# Patient Record
Sex: Male | Born: 1960 | Race: White | Hispanic: No | Marital: Married | Smoking: Former smoker
Health system: Southern US, Community
[De-identification: ages and names within clinical notes are randomized; demographics above are authoritative.]

## PROBLEM LIST (undated history)

## (undated) DIAGNOSIS — N189 Chronic kidney disease, unspecified: Secondary | ICD-10-CM

## (undated) DIAGNOSIS — I2510 Atherosclerotic heart disease of native coronary artery without angina pectoris: Secondary | ICD-10-CM

## (undated) DIAGNOSIS — Z8551 Personal history of malignant neoplasm of bladder: Secondary | ICD-10-CM

## (undated) DIAGNOSIS — M1990 Unspecified osteoarthritis, unspecified site: Secondary | ICD-10-CM

## (undated) DIAGNOSIS — E039 Hypothyroidism, unspecified: Secondary | ICD-10-CM

## (undated) DIAGNOSIS — R011 Cardiac murmur, unspecified: Secondary | ICD-10-CM

## (undated) DIAGNOSIS — C801 Malignant (primary) neoplasm, unspecified: Secondary | ICD-10-CM

## (undated) HISTORY — PX: OTHER SURGICAL HISTORY: SHX169

## (undated) HISTORY — PX: HERNIA REPAIR: SHX51

## (undated) HISTORY — PX: APPENDECTOMY: SHX54

## (undated) HISTORY — PX: BLADDER SURGERY: SHX569

## (undated) HISTORY — PX: TONSILLECTOMY: SUR1361

## (undated) HISTORY — DX: Personal history of malignant neoplasm of bladder: Z85.51

---

## 2004-10-31 ENCOUNTER — Ambulatory Visit: Payer: Self-pay | Admitting: Urology

## 2005-02-14 ENCOUNTER — Ambulatory Visit: Payer: Self-pay | Admitting: Ophthalmology

## 2006-09-18 ENCOUNTER — Ambulatory Visit: Payer: Self-pay | Admitting: Urology

## 2007-01-30 ENCOUNTER — Encounter: Payer: Self-pay | Admitting: Family Medicine

## 2007-02-08 ENCOUNTER — Encounter: Payer: Self-pay | Admitting: Family Medicine

## 2009-10-12 ENCOUNTER — Ambulatory Visit: Payer: Self-pay | Admitting: Ophthalmology

## 2012-08-15 ENCOUNTER — Ambulatory Visit: Payer: Self-pay | Admitting: General Surgery

## 2012-12-08 DIAGNOSIS — Z8551 Personal history of malignant neoplasm of bladder: Secondary | ICD-10-CM | POA: Insufficient documentation

## 2014-01-29 DIAGNOSIS — C649 Malignant neoplasm of unspecified kidney, except renal pelvis: Secondary | ICD-10-CM | POA: Insufficient documentation

## 2014-02-08 DIAGNOSIS — N433 Hydrocele, unspecified: Secondary | ICD-10-CM | POA: Insufficient documentation

## 2014-11-01 ENCOUNTER — Ambulatory Visit: Payer: Self-pay | Admitting: Family Medicine

## 2015-03-29 NOTE — Op Note (Signed)
PATIENT NAME:  Marc [name], Marc [name] MR#:  [id number] DATE OF BIRTH:  14-Aug-1961  DATE OF PROCEDURE:  08/15/2012  PREOPERATIVE DIAGNOSIS: Umbilical hernia.   POSTOPERATIVE DIAGNOSIS: Umbilical hernia.   OPERATIVE PROCEDURE: Primary repair of umbilical hernia.   OPERATING SURGEON: Robert Bellow, MD   ANESTHESIA: General by LMA, Marcaine 0.5% plain, 30 mL local infiltration, Toradol 30 mg.   ESTIMATED BLOOD LOSS: Minimal.   CLINICAL NOTE: This 54 year old male has developed a symptomatic hernia. He is admitted for elective repair. He received Kefzol prior to the procedure in the event mesh was required for repair.   OPERATIVE NOTE: Hair was removed with clippers prior to the procedure. The abdomen was prepped with ChloraPrep and draped. An infraumbilical incision was made and carried down through the skin and subcutaneous tissue with hemostasis achieved by electrocautery. He was found to have a small 8 mm fascial defect of the umbilicus. The undersurface was cleared and the defect was closed with interrupted 0 Surgilon sutures. The umbilical skin was tacked to the fascia with 2-0 Vicryl and the adipose layer approximated with a running 2-0 Vicryl suture. Skin was closed with a running 4-0 Vicryl subcuticular suture. Benzoin, Steri-Strips, Telfa, and Tegaderm dressing was then applied.   The patient tolerated the procedure well and was taken to the recovery room in stable condition.   ____________________________ Robert Bellow, MD jwb:drc D: 08/15/2012 14:08:55 ET T: 08/16/2012 09:17:13 ET JOB#: [id number]  cc: Robert Bellow, MD, <Dictator> Jerrell Belfast, MD Zuly Belkin Amedeo Kinsman MD ELECTRONICALLY SIGNED 08/21/2012 15:00

## 2015-05-30 ENCOUNTER — Other Ambulatory Visit: Payer: Self-pay | Admitting: Family Medicine

## 2015-05-30 DIAGNOSIS — E039 Hypothyroidism, unspecified: Secondary | ICD-10-CM

## 2015-05-31 DIAGNOSIS — E039 Hypothyroidism, unspecified: Secondary | ICD-10-CM | POA: Insufficient documentation

## 2015-05-31 NOTE — Telephone Encounter (Signed)
Last refill 05/25/2014. Last FU OV 01/20/2014. Last TSH 01/22/2014; result 8.340. FYI pt is taking 250 mcg of Levothyroxine. Pt was supposed to have TSH and T4 rechecked 4 weeks after last TSH, and never had the labs repeated. Renaldo Fiddler, CMA

## 2015-08-06 ENCOUNTER — Other Ambulatory Visit: Payer: Self-pay | Admitting: Family Medicine

## 2015-08-09 ENCOUNTER — Other Ambulatory Visit: Payer: Self-pay | Admitting: Family Medicine

## 2015-08-10 ENCOUNTER — Other Ambulatory Visit: Payer: Self-pay | Admitting: Family Medicine

## 2015-08-10 DIAGNOSIS — E039 Hypothyroidism, unspecified: Secondary | ICD-10-CM

## 2015-08-10 NOTE — Telephone Encounter (Signed)
Last apt 10/2014  Thanks,   -Mickel Baas

## 2016-05-16 ENCOUNTER — Ambulatory Visit: Payer: Self-pay | Admitting: Physician Assistant

## 2016-05-22 DIAGNOSIS — R9431 Abnormal electrocardiogram [ECG] [EKG]: Secondary | ICD-10-CM | POA: Insufficient documentation

## 2016-05-24 NOTE — Pre-Procedure Instructions (Signed)
Marc [name]  05/24/2016      [id number] [address]on, [place] - [place] [address] Phone: [phone] Fax: [facility] - [place], [place] - [address] [place] [address] Phone: [phone] Fax: [phone]    Your procedure is scheduled on June 21.  Report to [facility] Admitting at 630 A.M.  Call this number if you have problems the morning of surgery:  [phone]   Remember:  Do not eat food or drink liquids after midnight.  Take these medicines the morning of surgery with A SIP OF WATER tylenol if needed, levothyroxine (Synthroid), timolol (Betmol) eye drops  Stop taking aspirin, Ibuprofen, Advil, motrin, BC's, Goody's, Herbal medications, Fish oil, Aleve   Do not wear jewelry, make-up or nail polish.  Do not wear lotions, powders, or perfumes.  You may wear deoderant.  Do not shave 48 hours prior to surgery.  Men may shave face and neck.  Do not bring valuables to the hospital.  [facility] is not responsible for any belongings or valuables.  Contacts, dentures or bridgework may not be worn into surgery.  Leave your suitcase in the car.  After surgery it may be brought to your room.  For patients admitted to the hospital, discharge time will be determined by your treatment team.  Patients discharged the day of surgery will not be allowed to drive home.    Special instructions:  [facility] - Preparing for Surgery  Before surgery, you can play an important role.  Because skin is not sterile, your skin needs to be as free of germs as possible.  You can reduce the number of germs on you skin by washing with CHG (chlorahexidine gluconate) soap before surgery.  CHG is an antiseptic cleaner which kills germs and bonds with the skin to continue killing germs even after washing.  Please DO NOT use if you have an allergy to  CHG or antibacterial soaps.  If your skin becomes reddened/irritated stop using the CHG and inform your nurse when you arrive at Short Stay.  Do not shave (including legs and underarms) for at least 48 hours prior to the first CHG shower.  You may shave your face.  Please follow these instructions carefully:   1.  Shower with CHG Soap the night before surgery and the   morning of Surgery.  2.  If you choose to wash your hair, wash your hair first as usual with your  normal shampoo.  3.  After you shampoo, rinse your hair and body thoroughly to remove the Shampoo.  4.  Use CHG as you would any other liquid soap.  You can apply chg directly  to the skin and wash gently with scrungie or a clean washcloth.  5.  Apply the CHG Soap to your body ONLY FROM THE NECK DOWN.   Do not use on open wounds or open sores.  Avoid contact with your eyes,   ears, mouth and genitals (private parts).  Wash genitals (private parts)  with your normal soap.  6.  Wash thoroughly, paying special attention to the area where your surgery will be performed.  7.  Thoroughly rinse your body with warm water from the neck down.  8.  DO NOT shower/wash with your normal soap after using and rinsing off   the CHG Soap.  9.  Pat yourself dry with a clean towel.            10.  Wear clean pajamas.            11.  Place clean sheets on your bed the night of your first shower and do not sleep with pets.  Day of Surgery  Do not apply any lotions/deoderants the morning of surgery.  Please wear clean clothes to the hospital/surgery center.     Please read over the following fact sheets that you were given. Pain Booklet, MRSA Information and Surgical Site Infection Prevention

## 2016-05-25 ENCOUNTER — Encounter (HOSPITAL_COMMUNITY)
Admission: RE | Admit: 2016-05-25 | Discharge: 2016-05-25 | Disposition: A | Discharge status: Home / Self Care | Payer: 59 | Source: Ambulatory Visit | Attending: Orthopedic Surgery | Admitting: Orthopedic Surgery

## 2016-05-25 ENCOUNTER — Encounter (HOSPITAL_COMMUNITY): Payer: Self-pay

## 2016-05-25 DIAGNOSIS — Z905 Acquired absence of kidney: Secondary | ICD-10-CM | POA: Diagnosis not present

## 2016-05-25 DIAGNOSIS — M4806 Spinal stenosis, lumbar region: Secondary | ICD-10-CM | POA: Diagnosis not present

## 2016-05-25 DIAGNOSIS — M5126 Other intervertebral disc displacement, lumbar region: Secondary | ICD-10-CM | POA: Insufficient documentation

## 2016-05-25 DIAGNOSIS — Z01818 Encounter for other preprocedural examination: Secondary | ICD-10-CM | POA: Diagnosis not present

## 2016-05-25 DIAGNOSIS — Z85528 Personal history of other malignant neoplasm of kidney: Secondary | ICD-10-CM | POA: Insufficient documentation

## 2016-05-25 DIAGNOSIS — Z01812 Encounter for preprocedural laboratory examination: Secondary | ICD-10-CM | POA: Diagnosis not present

## 2016-05-25 DIAGNOSIS — E039 Hypothyroidism, unspecified: Secondary | ICD-10-CM | POA: Diagnosis not present

## 2016-05-25 DIAGNOSIS — Z79899 Other long term (current) drug therapy: Secondary | ICD-10-CM | POA: Diagnosis not present

## 2016-05-25 DIAGNOSIS — H409 Unspecified glaucoma: Secondary | ICD-10-CM | POA: Diagnosis not present

## 2016-05-25 DIAGNOSIS — Z87891 Personal history of nicotine dependence: Secondary | ICD-10-CM | POA: Insufficient documentation

## 2016-05-25 HISTORY — DX: Chronic kidney disease, unspecified: N18.9

## 2016-05-25 HISTORY — DX: Cardiac murmur, unspecified: R01.1

## 2016-05-25 HISTORY — DX: Hypothyroidism, unspecified: E03.9

## 2016-05-25 HISTORY — DX: Malignant (primary) neoplasm, unspecified: C80.1

## 2016-05-25 LAB — COMPREHENSIVE METABOLIC PANEL
ALT: 19 U/L (ref 17–63)
AST: 19 U/L (ref 15–41)
Albumin: 4.2 g/dL (ref 3.5–5.0)
Alkaline Phosphatase: 63 U/L (ref 38–126)
Anion gap: 10 (ref 5–15)
BUN: 26 mg/dL — AB (ref 6–20)
CHLORIDE: 107 mmol/L (ref 101–111)
CO2: 19 mmol/L — AB (ref 22–32)
CREATININE: 1.24 mg/dL (ref 0.61–1.24)
Calcium: 9.6 mg/dL (ref 8.9–10.3)
GFR calc Af Amer: >60 mL/min (ref >60)
Glucose, Bld: 85 mg/dL (ref 65–99)
Potassium: 4.5 mmol/L (ref 3.5–5.1)
Sodium: 136 mmol/L (ref 135–145)
Total Bilirubin: 0.5 mg/dL (ref 0.3–1.2)
Total Protein: 6.5 g/dL (ref 6.5–8.1)

## 2016-05-25 LAB — CBC
HCT: 45.6 % (ref 39.0–52.0)
Hemoglobin: 16.4 g/dL (ref 13.0–17.0)
MCH: 31.7 pg (ref 26.0–34.0)
MCHC: 36 g/dL (ref 30.0–36.0)
MCV: 88 fL (ref 78.0–100.0)
PLATELETS: 181 10*3/uL (ref 150–400)
RBC: 5.18 MIL/uL (ref 4.22–5.81)
RDW: 13.1 % (ref 11.5–15.5)
WBC: 6.2 10*3/uL (ref 4.0–10.5)

## 2016-05-25 LAB — SURGICAL PCR SCREEN
MRSA, PCR: NEGATIVE
STAPHYLOCOCCUS AUREUS: NEGATIVE

## 2016-05-25 NOTE — Progress Notes (Signed)
PCP is Dr. Chrystine Oiler Cardiologist is Dr. Josefa Half Pt states he had a stress test yesterday at [facility] sent for results, last office visit, and EKG requested Denies ever having a card cath or echo.

## 2016-05-28 NOTE — Progress Notes (Signed)
Anesthesia Chart Review:  Pt is a 55 year old male scheduled for L2-3 lumbar decompression discectomy on 05/30/2016 with Dr. Rolena Infante.   PCP is Dr. Maryland Pink (care everywhere)  PMH includes:  Heart murmur, hypothyroidism, renal cell carcinoma (s/p R nephrectomy 2015), glaucoma. Former smoker.   Medications include: levothyroxine, timolol ophthalmic.   Preoperative labs reviewed.    EKG 05/17/16: sinus rhythm. Q waves in aVF.   Nuclear stress test 05/24/16 (care everywhere):  1.Mild reduced left ventricular function  2.Apical wall hypokinesis  3.No evidence for scar or ischemia  Pt saw Dr. Saralyn Pilar with cardiology (care everywhere) for pre-op eval; stress test ordered as above.   If no changes, I anticipate pt can proceed with surgery as scheduled.   Willeen Cass, FNP-BC [facility] Short Stay Surgical Center/Anesthesiology Phone: [phone] 05/28/2016 1:46 PM

## 2016-05-29 MED ORDER — VANCOMYCIN HCL 10 G IV SOLR
1500.0000 mg | INTRAVENOUS | Status: AC
  Administered 2016-05-30: 1500 mg via INTRAVENOUS
  Filled 2016-05-29: qty 1500

## 2016-05-29 NOTE — Anesthesia Preprocedure Evaluation (Addendum)
Anesthesia Evaluation  Patient identified by MRN, date of birth, ID band Patient awake    Reviewed: Allergy & Precautions, NPO status , Patient's Chart, lab work & pertinent test results  Airway Mallampati: II   Neck ROM: Full    Dental  (+) Dental Advisory Given   Pulmonary neg pulmonary ROS, former smoker (quit 2005),    breath sounds clear to auscultation       Cardiovascular negative cardio ROS   Rhythm:Regular  STRESS 05/2016  LVF 43%, no scar or ischemia noted, cleared by a cardiologist   Neuro/Psych negative neurological ROS  negative psych ROS   GI/Hepatic negative GI ROS, Neg liver ROS,   Endo/Other  Hypothyroidism Obesity BMI 35.5  Renal/GU negative Renal ROSSP Right nephrectomy for renal cell CA, Creat 1.24  negative genitourinary   Musculoskeletal negative musculoskeletal ROS (+)   Abdominal   Peds negative pediatric ROS (+)  Hematology negative hematology ROS (+) 16/45   Anesthesia Other Findings   Reproductive/Obstetrics negative OB ROS                         Anesthesia Physical Anesthesia Plan  ASA: III  Anesthesia Plan: General   Post-op Pain Management:    Induction: Intravenous  Airway Management Planned: Oral ETT and Video Laryngoscope Planned  Additional Equipment:   Intra-op Plan:   Post-operative Plan: Extubation in OR  Informed Consent: I have reviewed the patients History and Physical, chart, labs and discussed the procedure including the risks, benefits and alternatives for the proposed anesthesia with the patient or authorized representative who has indicated his/her understanding and acceptance.     Plan Discussed with:   Anesthesia Plan Comments: (No Toradol SP right nephrectomy)        Anesthesia Quick Evaluation

## 2016-05-30 ENCOUNTER — Ambulatory Visit (HOSPITAL_COMMUNITY): Payer: 59

## 2016-05-30 ENCOUNTER — Ambulatory Visit (HOSPITAL_COMMUNITY): Payer: 59 | Admitting: Emergency Medicine

## 2016-05-30 ENCOUNTER — Observation Stay (HOSPITAL_COMMUNITY)
Admission: RE | Admit: 2016-05-30 | Discharge: 2016-05-30 | Disposition: A | Discharge status: Home / Self Care | Payer: 59 | Source: Ambulatory Visit | Attending: Orthopedic Surgery | Admitting: Orthopedic Surgery

## 2016-05-30 ENCOUNTER — Encounter (HOSPITAL_COMMUNITY): Payer: Self-pay | Admitting: Surgery

## 2016-05-30 ENCOUNTER — Encounter (HOSPITAL_COMMUNITY)
Admission: RE | Disposition: A | Discharge status: Home / Self Care | Payer: Self-pay | Source: Ambulatory Visit | Attending: Orthopedic Surgery

## 2016-05-30 ENCOUNTER — Ambulatory Visit (HOSPITAL_COMMUNITY): Payer: 59 | Admitting: Anesthesiology

## 2016-05-30 DIAGNOSIS — E039 Hypothyroidism, unspecified: Secondary | ICD-10-CM | POA: Diagnosis not present

## 2016-05-30 DIAGNOSIS — Z87891 Personal history of nicotine dependence: Secondary | ICD-10-CM | POA: Diagnosis not present

## 2016-05-30 DIAGNOSIS — M545 Low back pain, unspecified: Secondary | ICD-10-CM | POA: Insufficient documentation

## 2016-05-30 DIAGNOSIS — Z419 Encounter for procedure for purposes other than remedying health state, unspecified: Secondary | ICD-10-CM

## 2016-05-30 DIAGNOSIS — M5410 Radiculopathy, site unspecified: Secondary | ICD-10-CM | POA: Insufficient documentation

## 2016-05-30 DIAGNOSIS — Z905 Acquired absence of kidney: Secondary | ICD-10-CM | POA: Insufficient documentation

## 2016-05-30 DIAGNOSIS — Z6835 Body mass index (BMI) 35.0-35.9, adult: Secondary | ICD-10-CM | POA: Insufficient documentation

## 2016-05-30 DIAGNOSIS — M5126 Other intervertebral disc displacement, lumbar region: Principal | ICD-10-CM | POA: Insufficient documentation

## 2016-05-30 DIAGNOSIS — Z85528 Personal history of other malignant neoplasm of kidney: Secondary | ICD-10-CM | POA: Insufficient documentation

## 2016-05-30 DIAGNOSIS — E669 Obesity, unspecified: Secondary | ICD-10-CM | POA: Diagnosis not present

## 2016-05-30 DIAGNOSIS — Z880 Allergy status to penicillin: Secondary | ICD-10-CM | POA: Insufficient documentation

## 2016-05-30 DIAGNOSIS — M549 Dorsalgia, unspecified: Secondary | ICD-10-CM | POA: Diagnosis present

## 2016-05-30 HISTORY — PX: LUMBAR LAMINECTOMY/DECOMPRESSION MICRODISCECTOMY: SHX5026

## 2016-05-30 SURGERY — LUMBAR LAMINECTOMY/DECOMPRESSION MICRODISCECTOMY 1 LEVEL
Anesthesia: General | Site: Spine Lumbar

## 2016-05-30 MED ORDER — METHOCARBAMOL 500 MG PO TABS: 500.0000 mg | ORAL_TABLET | Freq: Three times a day (TID) | ORAL | Status: DC | PRN

## 2016-05-30 MED ORDER — OXYCODONE HCL 5 MG PO TABS
10.0000 mg | ORAL_TABLET | ORAL | Status: DC | PRN
  Administered 2016-05-30 (×2): 10 mg via ORAL
  Filled 2016-05-30: qty 2

## 2016-05-30 MED ORDER — FENTANYL CITRATE (PF) 100 MCG/2ML IJ SOLN
INTRAMUSCULAR | Status: DC
Start: 2016-05-30 — End: 2016-05-30
  Filled 2016-05-30: qty 2

## 2016-05-30 MED ORDER — PHENOL 1.4 % MT LIQD: 1.0000 | OROMUCOSAL | Status: DC | PRN

## 2016-05-30 MED ORDER — METHOCARBAMOL 500 MG PO TABS
ORAL_TABLET | ORAL | Status: AC
  Filled 2016-05-30: qty 1

## 2016-05-30 MED ORDER — PROPOFOL 10 MG/ML IV BOLUS
INTRAVENOUS | Status: AC
  Filled 2016-05-30: qty 40

## 2016-05-30 MED ORDER — ACETAMINOPHEN 500 MG PO TABS
1000.0000 mg | ORAL_TABLET | Freq: Four times a day (QID) | ORAL | Status: DC | PRN
  Administered 2016-05-30: 1000 mg via ORAL
  Filled 2016-05-30: qty 2

## 2016-05-30 MED ORDER — ONDANSETRON HCL 4 MG/2ML IJ SOLN
INTRAMUSCULAR | Status: DC | PRN
  Administered 2016-05-30: 4 mg via INTRAVENOUS

## 2016-05-30 MED ORDER — FENTANYL CITRATE (PF) 250 MCG/5ML IJ SOLN
INTRAMUSCULAR | Status: AC
  Filled 2016-05-30: qty 5

## 2016-05-30 MED ORDER — PROPOFOL 10 MG/ML IV BOLUS
INTRAVENOUS | Status: DC | PRN
  Administered 2016-05-30: 50 mg via INTRAVENOUS
  Administered 2016-05-30: 250 mg via INTRAVENOUS

## 2016-05-30 MED ORDER — THROMBIN 20000 UNITS EX KIT
PACK | CUTANEOUS | Status: DC | PRN
  Administered 2016-05-30: 20 mL via TOPICAL

## 2016-05-30 MED ORDER — ONDANSETRON HCL 4 MG/2ML IJ SOLN
INTRAMUSCULAR | Status: AC
  Filled 2016-05-30: qty 2

## 2016-05-30 MED ORDER — BUPIVACAINE-EPINEPHRINE (PF) 0.25% -1:200000 IJ SOLN
INTRAMUSCULAR | Status: AC
  Filled 2016-05-30: qty 30

## 2016-05-30 MED ORDER — ROCURONIUM BROMIDE 50 MG/5ML IV SOLN
INTRAVENOUS | Status: AC
  Filled 2016-05-30: qty 2

## 2016-05-30 MED ORDER — SUGAMMADEX SODIUM 500 MG/5ML IV SOLN
INTRAVENOUS | Status: AC
  Filled 2016-05-30: qty 5

## 2016-05-30 MED ORDER — ONDANSETRON HCL 4 MG/2ML IJ SOLN
4.0000 mg | INTRAMUSCULAR | Status: DC | PRN
Start: 2016-05-30 — End: 2016-05-30
  Administered 2016-05-30: 4 mg via INTRAVENOUS
  Filled 2016-05-30: qty 2

## 2016-05-30 MED ORDER — DEXTROSE 5 % IV SOLN
INTRAVENOUS | Status: DC | PRN
  Administered 2016-05-30: 10:00:00 via INTRAVENOUS

## 2016-05-30 MED ORDER — BUPIVACAINE-EPINEPHRINE 0.25% -1:200000 IJ SOLN
INTRAMUSCULAR | Status: DC | PRN
  Administered 2016-05-30: 10 mL

## 2016-05-30 MED ORDER — ROCURONIUM BROMIDE 100 MG/10ML IV SOLN
INTRAVENOUS | Status: DC | PRN
  Administered 2016-05-30: 50 mg via INTRAVENOUS
  Administered 2016-05-30: 10 mg via INTRAVENOUS

## 2016-05-30 MED ORDER — LACTATED RINGERS IV SOLN
INTRAVENOUS | Status: DC | PRN
  Administered 2016-05-30 (×2): via INTRAVENOUS

## 2016-05-30 MED ORDER — PROMETHAZINE HCL 25 MG/ML IJ SOLN: 6.2500 mg | INTRAMUSCULAR | Status: DC | PRN

## 2016-05-30 MED ORDER — LIDOCAINE HCL (CARDIAC) 20 MG/ML IV SOLN
INTRAVENOUS | Status: DC | PRN
  Administered 2016-05-30: 100 mg via INTRAVENOUS

## 2016-05-30 MED ORDER — 0.9 % SODIUM CHLORIDE (POUR BTL) OPTIME
TOPICAL | Status: DC | PRN
  Administered 2016-05-30: 1000 mL

## 2016-05-30 MED ORDER — OXYCODONE-ACETAMINOPHEN 10-325 MG PO TABS: 1.0000 | ORAL_TABLET | ORAL | Status: DC | PRN

## 2016-05-30 MED ORDER — LACTATED RINGERS IV SOLN: INTRAVENOUS | Status: DC

## 2016-05-30 MED ORDER — FENTANYL CITRATE (PF) 100 MCG/2ML IJ SOLN
25.0000 ug | INTRAMUSCULAR | Status: DC | PRN
  Administered 2016-05-30 (×3): 50 ug via INTRAVENOUS

## 2016-05-30 MED ORDER — METHOCARBAMOL 500 MG PO TABS
500.0000 mg | ORAL_TABLET | Freq: Four times a day (QID) | ORAL | Status: DC | PRN
  Administered 2016-05-30: 500 mg via ORAL

## 2016-05-30 MED ORDER — PHENYLEPHRINE 40 MCG/ML (10ML) SYRINGE FOR IV PUSH (FOR BLOOD PRESSURE SUPPORT)
PREFILLED_SYRINGE | INTRAVENOUS | Status: AC
  Filled 2016-05-30: qty 10

## 2016-05-30 MED ORDER — LEVOTHYROXINE SODIUM 100 MCG PO TABS: 200.0000 ug | ORAL_TABLET | Freq: Every day | ORAL | Status: DC

## 2016-05-30 MED ORDER — SODIUM CHLORIDE 0.9% FLUSH
3.0000 mL | Freq: Two times a day (BID) | INTRAVENOUS | Status: DC
  Administered 2016-05-30: 3 mL via INTRAVENOUS

## 2016-05-30 MED ORDER — DEXAMETHASONE SODIUM PHOSPHATE 4 MG/ML IJ SOLN
4.0000 mg | Freq: Four times a day (QID) | INTRAMUSCULAR | Status: DC
  Administered 2016-05-30: 4 mg via INTRAVENOUS
  Filled 2016-05-30: qty 1

## 2016-05-30 MED ORDER — ONDANSETRON HCL 4 MG PO TABS: 4.0000 mg | ORAL_TABLET | Freq: Three times a day (TID) | ORAL | Status: DC | PRN

## 2016-05-30 MED ORDER — MIDAZOLAM HCL 2 MG/2ML IJ SOLN
INTRAMUSCULAR | Status: AC
  Filled 2016-05-30: qty 2

## 2016-05-30 MED ORDER — MIDAZOLAM HCL 5 MG/5ML IJ SOLN
INTRAMUSCULAR | Status: DC | PRN
  Administered 2016-05-30: 2 mg via INTRAVENOUS

## 2016-05-30 MED ORDER — ARTIFICIAL TEARS OP OINT
TOPICAL_OINTMENT | OPHTHALMIC | Status: DC | PRN
  Administered 2016-05-30: 1 via OPHTHALMIC

## 2016-05-30 MED ORDER — METHOCARBAMOL 1000 MG/10ML IJ SOLN
500.0000 mg | Freq: Four times a day (QID) | INTRAMUSCULAR | Status: DC | PRN
  Filled 2016-05-30: qty 5

## 2016-05-30 MED ORDER — OXYCODONE HCL 5 MG PO TABS
ORAL_TABLET | ORAL | Status: AC
  Filled 2016-05-30: qty 2

## 2016-05-30 MED ORDER — MORPHINE SULFATE (PF) 2 MG/ML IV SOLN: 1.0000 mg | INTRAVENOUS | Status: DC | PRN

## 2016-05-30 MED ORDER — SUCCINYLCHOLINE CHLORIDE 20 MG/ML IJ SOLN
INTRAMUSCULAR | Status: DC | PRN
  Administered 2016-05-30: 140 mg via INTRAVENOUS

## 2016-05-30 MED ORDER — VANCOMYCIN HCL 10 G IV SOLR
1250.0000 mg | Freq: Once | INTRAVENOUS | Status: DC
  Filled 2016-05-30: qty 1250

## 2016-05-30 MED ORDER — SUCCINYLCHOLINE CHLORIDE 200 MG/10ML IV SOSY
PREFILLED_SYRINGE | INTRAVENOUS | Status: AC
  Filled 2016-05-30: qty 10

## 2016-05-30 MED ORDER — PHENYLEPHRINE HCL 10 MG/ML IJ SOLN
INTRAMUSCULAR | Status: DC | PRN
  Administered 2016-05-30 (×3): 80 ug via INTRAVENOUS
  Administered 2016-05-30: 120 ug via INTRAVENOUS
  Administered 2016-05-30: 40 ug via INTRAVENOUS

## 2016-05-30 MED ORDER — SODIUM CHLORIDE 0.9 % IV SOLN
10.0000 mg | INTRAVENOUS | Status: DC | PRN
  Administered 2016-05-30: 25 ug/min via INTRAVENOUS

## 2016-05-30 MED ORDER — THROMBIN 20000 UNITS EX SOLR
CUTANEOUS | Status: AC
  Filled 2016-05-30: qty 20000

## 2016-05-30 MED ORDER — PHENYLEPHRINE HCL 10 MG/ML IJ SOLN
INTRAMUSCULAR | Status: AC
  Filled 2016-05-30: qty 1

## 2016-05-30 MED ORDER — SODIUM CHLORIDE 0.9% FLUSH: 3.0000 mL | INTRAVENOUS | Status: DC | PRN

## 2016-05-30 MED ORDER — ALBUMIN HUMAN 5 % IV SOLN
INTRAVENOUS | Status: DC | PRN
  Administered 2016-05-30: 10:00:00 via INTRAVENOUS

## 2016-05-30 MED ORDER — HEMOSTATIC AGENTS (NO CHARGE) OPTIME
TOPICAL | Status: DC | PRN
  Administered 2016-05-30: 1 via TOPICAL

## 2016-05-30 MED ORDER — FENTANYL CITRATE (PF) 100 MCG/2ML IJ SOLN
INTRAMUSCULAR | Status: DC | PRN
  Administered 2016-05-30 (×5): 50 ug via INTRAVENOUS

## 2016-05-30 MED ORDER — DEXAMETHASONE 4 MG PO TABS: 4.0000 mg | ORAL_TABLET | Freq: Four times a day (QID) | ORAL | Status: DC

## 2016-05-30 MED ORDER — SUGAMMADEX SODIUM 200 MG/2ML IV SOLN
INTRAVENOUS | Status: DC | PRN
  Administered 2016-05-30: 400 mg via INTRAVENOUS

## 2016-05-30 MED ORDER — MEPERIDINE HCL 25 MG/ML IJ SOLN: 6.2500 mg | INTRAMUSCULAR | Status: DC | PRN

## 2016-05-30 MED ORDER — MENTHOL 3 MG MT LOZG
1.0000 | LOZENGE | OROMUCOSAL | Status: DC | PRN
Start: 2016-05-30 — End: 2016-05-30

## 2016-05-30 SURGICAL SUPPLY — 64 items
BUR EGG ELITE 4.0 (BURR) IMPLANT
BUR EGG ELITE 4.0MM (BURR)
BUR MATCHSTICK NEURO 3.0 LAGG (BURR) IMPLANT
CANISTER SUCTION 2500CC (MISCELLANEOUS) ×3 IMPLANT
CLOSURE STERI-STRIP 1/2X4 (GAUZE/BANDAGES/DRESSINGS) ×1
CLSR STERI-STRIP ANTIMIC 1/2X4 (GAUZE/BANDAGES/DRESSINGS) ×2 IMPLANT
CORDS BIPOLAR (ELECTRODE) ×3 IMPLANT
COVER SURGICAL LIGHT HANDLE (MISCELLANEOUS) ×3 IMPLANT
DRAIN CHANNEL 15F RND FF W/TCR (WOUND CARE) IMPLANT
DRAPE POUCH INSTRU U-SHP 10X18 (DRAPES) ×3 IMPLANT
DRAPE SURG 17X23 STRL (DRAPES) ×3 IMPLANT
DRAPE U-SHAPE 47X51 STRL (DRAPES) ×3 IMPLANT
DRSG AQUACEL AG ADV 3.5X 6 (GAUZE/BANDAGES/DRESSINGS) ×3 IMPLANT
DRSG MEPILEX BORDER 4X8 (GAUZE/BANDAGES/DRESSINGS) IMPLANT
DURAPREP 26ML APPLICATOR (WOUND CARE) ×3 IMPLANT
ELECT BLADE 4.0 EZ CLEAN MEGAD (MISCELLANEOUS) ×3
ELECT CAUTERY BLADE 6.4 (BLADE) IMPLANT
ELECT PENCIL ROCKER SW 15FT (MISCELLANEOUS) ×3 IMPLANT
ELECT REM PT RETURN 9FT ADLT (ELECTROSURGICAL) ×3
ELECTRODE BLDE 4.0 EZ CLN MEGD (MISCELLANEOUS) ×1 IMPLANT
ELECTRODE REM PT RTRN 9FT ADLT (ELECTROSURGICAL) ×1 IMPLANT
EVACUATOR SILICONE 100CC (DRAIN) IMPLANT
GLOVE BIO SURGEON STRL SZ 6.5 (GLOVE) ×2 IMPLANT
GLOVE BIO SURGEONS STRL SZ 6.5 (GLOVE) ×1
GLOVE BIOGEL PI IND STRL 6.5 (GLOVE) ×1 IMPLANT
GLOVE BIOGEL PI IND STRL 8.5 (GLOVE) ×1 IMPLANT
GLOVE BIOGEL PI INDICATOR 6.5 (GLOVE) ×2
GLOVE BIOGEL PI INDICATOR 8.5 (GLOVE) ×2
GLOVE SS BIOGEL STRL SZ 8.5 (GLOVE) ×1 IMPLANT
GLOVE SUPERSENSE BIOGEL SZ 8.5 (GLOVE) ×2
GOWN STRL REUS W/TWL 2XL LVL3 (GOWN DISPOSABLE) ×6 IMPLANT
KIT BASIN OR (CUSTOM PROCEDURE TRAY) ×3 IMPLANT
KIT ROOM TURNOVER OR (KITS) ×3 IMPLANT
NEEDLE 22X1 1/2 (OR ONLY) (NEEDLE) ×3 IMPLANT
NEEDLE SPNL 18GX3.5 QUINCKE PK (NEEDLE) ×6 IMPLANT
NS IRRIG 1000ML POUR BTL (IV SOLUTION) ×6 IMPLANT
PACK LAMINECTOMY ORTHO (CUSTOM PROCEDURE TRAY) ×3 IMPLANT
PACK UNIVERSAL I (CUSTOM PROCEDURE TRAY) ×3 IMPLANT
PAD ARMBOARD 7.5X6 YLW CONV (MISCELLANEOUS) ×9 IMPLANT
PATTIES SURGICAL .5 X.5 (GAUZE/BANDAGES/DRESSINGS) IMPLANT
PATTIES SURGICAL .5 X1 (DISPOSABLE) ×3 IMPLANT
SPONGE SURGIFOAM ABS GEL 100 (HEMOSTASIS) ×3 IMPLANT
SURGIFLO W/THROMBIN 8M KIT (HEMOSTASIS) ×3 IMPLANT
SUT BONE WAX W31G (SUTURE) ×3 IMPLANT
SUT MON AB 3-0 SH 27 (SUTURE) ×2
SUT MON AB 3-0 SH27 (SUTURE) ×1 IMPLANT
SUT STRATAFIX 1PDS 45CM VIOLET (SUTURE) ×3 IMPLANT
SUT STRATAFIX MNCRL+ 3-0 PS-2 (SUTURE)
SUT STRATAFIX MONOCRYL 3-0 (SUTURE)
SUT STRATAFIX SPIRAL + 2-0 (SUTURE) IMPLANT
SUT VIC AB 0 CT1 27 (SUTURE) ×2
SUT VIC AB 0 CT1 27XBRD ANBCTR (SUTURE) ×1 IMPLANT
SUT VIC AB 1 CT1 18XCR BRD 8 (SUTURE) ×1 IMPLANT
SUT VIC AB 1 CT1 8-18 (SUTURE) ×2
SUT VIC AB 1 CTX 36 (SUTURE)
SUT VIC AB 1 CTX36XBRD ANBCTR (SUTURE) IMPLANT
SUT VIC AB 2-0 CT1 18 (SUTURE) ×3 IMPLANT
SUTURE STRATFX MNCRL+ 3-0 PS-2 (SUTURE) IMPLANT
SYR BULB IRRIGATION 50ML (SYRINGE) ×3 IMPLANT
SYR CONTROL 10ML LL (SYRINGE) ×3 IMPLANT
TOWEL OR 17X24 6PK STRL BLUE (TOWEL DISPOSABLE) ×3 IMPLANT
TOWEL OR 17X26 10 PK STRL BLUE (TOWEL DISPOSABLE) ×3 IMPLANT
WATER STERILE IRR 1000ML POUR (IV SOLUTION) IMPLANT
YANKAUER SUCT BULB TIP NO VENT (SUCTIONS) ×3 IMPLANT

## 2016-05-30 NOTE — Progress Notes (Signed)
Occupational Therapy Evaluation Patient Details Name: Marc [name] MRN: [id number] DOB: Dec 08, 1961 Today's Date: 05/30/2016    History of Present Illness Pt is a 55 y/o M s/p L2-3 decompression and discectomy.  Pt's PMH includes removal of Rt kidney, cancer, hypothyroidism.     Clinical Impression   Completed all education regarding compensatory techniques for ADL and use of AE if needed. Also educated pt/wife on proper set up at home to increase adherance with back precautions and reducing risk of falls. Pt ready to [facility] home when medically stable.     Follow Up Recommendations  No OT follow up;Supervision - Intermittent    Equipment Recommendations  None recommended by OT    Recommendations for Other Services       Precautions / Restrictions Precautions Precautions: Back Precaution Booklet Issued: Yes (comment) Precaution Comments: Provided pt w/ and reviewed precaution handout.  Pt able to recall no bending/arching/twisting at end of session without cues.   Required Braces or Orthoses: Spinal Brace Spinal Brace: Lumbar corset;Applied in sitting position (pt donned independently) Restrictions Weight Bearing Restrictions: No      Mobility Bed Mobility       General bed mobility comments: OOB in chair  Transfers Overall transfer level: Modified independent             Balance Overall balance assessment: Needs assistance Sitting-balance support: No upper extremity supported;Feet supported Sitting balance-Leahy Scale: Good     Standing balance support: No upper extremity supported;During functional activity Standing balance-Leahy Scale: Fair                              ADL Overall ADL's : Needs assistance/impaired                                     Functional mobility during ADLs: Modified independent General ADL Comments: Educated pt on compensatory techniques for ADL and use of AE to assist if needed for LB bating.dressing  adn hygiene after toileting. Educated on home safety, proper set up and reducing risk of falls. Pt verblaized understanding.     Vision     Perception     Praxis      Pertinent Vitals/Pain Pain Assessment: 0-10 Pain Score: 6  Pain Location: back Pain Descriptors / Indicators: Aching;Operative site guarding Pain Intervention(s): Limited activity within patient's tolerance     Hand Dominance Right   Extremity/Trunk Assessment Upper Extremity Assessment Upper Extremity Assessment: Overall WFL for tasks assessed   Lower Extremity Assessment Lower Extremity Assessment: Defer to PT evaluation   Cervical / Trunk Assessment Cervical / Trunk Assessment: Other exceptions Cervical / Trunk Exceptions: s/p lumbar decompression   Communication Communication Communication: No difficulties   Cognition Arousal/Alertness: Awake/alert Behavior During Therapy: WFL for tasks assessed/performed Overall Cognitive Status: Within Functional Limits for tasks assessed                     General Comments       Exercises Exercises: General Lower Extremity     Shoulder Instructions      Home Living Family/patient expects to be discharged to:: Private residence Living Arrangements: Spouse/significant other;Other relatives (wife and 33 y/o Curator) Available Help at Discharge: Family;Available 24 hours/day (wife is taking off work)until [place] Type of Home: House Home Access: Stairs to enter [employer] of Steps: 4 Entrance Stairs-Rails: Right  Home Layout: One level;Able to live on main level with bedroom/bathroom (3 steps down to TV room w/ no rail)     Bathroom Shower/Tub: Occupational psychologist: Standard     Home Equipment: None          Prior Functioning/Environment Level of Independence: Independent        Comments: Working full-time     OT Diagnosis: Generalized weakness;Acute pain   OT Problem List: Decreased strength;Decreased  activity tolerance;Decreased knowledge of use of DME or AE;Decreased knowledge of precautions;Pain   OT Treatment/Interventions:      OT Goals(Current goals can be found in the care plan section) Acute Rehab OT Goals Patient Stated Goal: to go home tonight OT Goal Formulation: All assessment and education complete, DC therapy  OT Frequency:     Barriers to D/C:            Co-evaluation              End of Session Equipment Utilized During Treatment: Back brace Nurse Communication: Mobility status  Activity Tolerance: Patient tolerated treatment well Patient left: with call bell/phone within reach;Other (comment) (standing)   Time: [id number] OT Time Calculation (min): 18 min Charges:  OT General Charges $OT Visit: 1 Procedure OT Evaluation $OT Eval Low Complexity: 1 Procedure G-Codes: OT G-codes **NOT FOR INPATIENT CLASS** Functional Assessment Tool Used: clinical judgement Functional Limitation: Self care Self Care Current Status [id number]): At least 1 percent but less than 20 percent impaired, limited or restricted Self Care Goal Status [id number]): At least 1 percent but less than 20 percent impaired, limited or restricted Self Care Discharge Status [phone]): At least 1 percent but less than 20 percent impaired, limited or restricted  Kieon Lawhorn,HILLARY 05/30/2016, 5:35 PM   [facility], OTR/L  [phone] 05/30/2016

## 2016-05-30 NOTE — Progress Notes (Signed)
Pharmacy Antibiotic Note  Marc [name] is a 55 y.o. male admitted on 05/30/2016 for a lumbar decompression/discectomy L2-3.  Marland Kitchen  Pharmacy has been consulted for Vancomycin IV dosing s/p lumbar decompresion/disectomy for surgical prophlaxis for 1 dose 12 hours post-op unless patient has a drain, then continue vancomycin until discontinued by physician. No drain placement noted. Note that the Pt only has one kidney because of kidney cancer.  SCr = 1.24  He received Vanc 1500 mg IV preop today @ 08:45.   Plan: Give Vancomcyin 1250 mg IV tonight @ 2030 x1 dose.      Temp (24hrs), Avg:97.8 F (36.6 C), Min:97.6 F (36.4 C), Max:98.1 F (36.7 C)   Recent Labs Lab 05/25/16 1544  WBC 6.2  CREATININE 1.24    CrCl cannot be calculated (Unknown ideal weight.).    Allergies  Allergen Reactions  . Amoxicillin Hives  . Sulfamethoxazole-Trimethoprim Nausea Only    Upset stomach    Antimicrobials this admission: Vancomycin 6/21 >>6/21 (preop & 1 dose post op)  Microbiology results:  none  Thank you for allowing pharmacy to be a part of this patient's care. Nicole Cella, RPh Clinical Pharmacist Pager: [phone] 05/30/2016 1:30 PM

## 2016-05-30 NOTE — Evaluation (Signed)
Physical Therapy Evaluation Patient Details Name: Marc [name] MRN: [id number] DOB: [birth year] Today's Date: 05/30/2016   History of Present Illness  Pt is a 55 y/o M s/p L2-3 decompression and discectomy.  Pt's PMH includes removal of Rt kidney, cancer, hypothyroidism.      Clinical Impression  Patient is s/p above surgery resulting in functional limitations due to the deficits listed below (see PT Problem List). Marc [name] completed stair training w/ light min assist, is ambulating at supervision level, and demonstrates Bil LE strength WNL.  His wife will be available to provide 24/7 assist/supervision at home at d/c.  He independently donned his back brace and pt and wife educated on back precautions and when to wear brace.      Follow Up Recommendations No PT follow up;Supervision for mobility/OOB    Equipment Recommendations  None recommended by PT    Recommendations for Other Services OT consult     Precautions / Restrictions Precautions Precautions: Back Precaution Booklet Issued: Yes (comment) Precaution Comments: Provided pt w/ and reviewed precaution handout.  Pt able to recall no bending/arching/twisting at end of session without cues.   Required Braces or Orthoses: Spinal Brace Spinal Brace: Lumbar corset;Applied in sitting position (pt donned independently) Restrictions Weight Bearing Restrictions: No      Mobility  Bed Mobility Overal bed mobility: Needs Assistance Bed Mobility: Rolling;Sidelying to Sit Rolling: Supervision Sidelying to sit: Supervision       General bed mobility comments: Cues for log roll technique.  Pt performs without physical assist.  Transfers Overall transfer level: Needs assistance Equipment used: None Transfers: Sit to/from Stand Sit to Stand: Supervision         General transfer comment: Supervision for safety.  Cue to reach back to armrests when sitting.  Ambulation/Gait Ambulation/Gait assistance:  Supervision Ambulation Distance (Feet): 300 Feet Assistive device: None Gait Pattern/deviations: Step-through pattern;Decreased stride length   Gait velocity interpretation: Below normal speed for age/gender General Gait Details: No instability noted.  Pt pleased w/ lack of shooting pain down legs.    Stairs Stairs: Yes Stairs assistance: Min assist Stair Management: One rail Right;No rails;Step to pattern;Forwards Number of Stairs: 7 (4,3) General stair comments: 4 steps w/ Rt rail (simulate entering home), 3 steps without rail w/ 1 person light HHA (simulate to TV room)  Wheelchair Mobility    Modified Rankin (Stroke Patients Only)       Balance Overall balance assessment: Needs assistance Sitting-balance support: No upper extremity supported;Feet supported Sitting balance-Leahy Scale: Good     Standing balance support: No upper extremity supported;During functional activity Standing balance-Leahy Scale: Fair                               Pertinent Vitals/Pain Pain Assessment: 0-10 Pain Score: 7  Pain Location: back Pain Descriptors / Indicators: Aching;Grimacing;Guarding Pain Intervention(s): Limited activity within patient's tolerance;Monitored during session;Premedicated before session;Repositioned    Home Living Family/patient expects to be discharged to:: Private residence Living Arrangements: Spouse/significant other;Other relatives (wife and 40 y/o Curator) Available Help at Discharge: Family;Available 24 hours/day (wife is taking off work) Type of Home: House Home Access: Stairs to enter Entrance Stairs-Rails: Right Entrance Stairs-Number of Steps: [place]: One level;Able to live on main level with bedroom/bathroom (3 steps down to TV room w/ no rail) Home Equipment: None      Prior Function Level of Independence: Independent  Comments: Working full-time      Journalist, newspaper   Dominant Hand: Right     Extremity/Trunk Assessment   Upper Extremity Assessment: Defer to OT evaluation           Lower Extremity Assessment: Overall WFL for tasks assessed      Cervical / Trunk Assessment: Other exceptions  Communication   Communication: No difficulties  Cognition Arousal/Alertness: Awake/alert Behavior During Therapy: WFL for tasks assessed/performed Overall Cognitive Status: Within Functional Limits for tasks assessed                      General Comments General comments (skin integrity, edema, etc.): Informed pt that when he is home he needs to get OOB or chair every 45 minutes to go on household distance walk.  Instructed pt to have wife with him when going up/down steps.      Exercises General Exercises - Lower Extremity Ankle Circles/Pumps: AROM;Both;10 reps;Seated      Assessment/Plan    PT Assessment Patent does not need any further PT services  PT Diagnosis Acute pain   PT Problem List    PT Treatment Interventions     PT Goals (Current goals can be found in the Care Plan section) Acute Rehab PT Goals Patient Stated Goal: to go home tonight PT Goal Formulation: With patient/family Time For Goal Achievement: 06/06/16 Potential to Achieve Goals: Good    Frequency     Barriers to discharge        Co-evaluation               End of Session Equipment Utilized During Treatment: Gait belt;Back brace          Functional Assessment Tool Used: Clinical Judgement Functional Limitation: Mobility: Walking and moving around Mobility: Walking and Moving Around Current Status [id number]): At least 1 percent but less than 20 percent impaired, limited or restricted Mobility: Walking and Moving Around Goal Status [phone]): At least 1 percent but less than 20 percent impaired, limited or restricted    Time: [id number] PT Time Calculation (min) (ACUTE ONLY): 33 min   Charges:   PT Evaluation $PT Eval Low Complexity: 1 Procedure PT Treatments $Gait  Training: 8-22 mins   PT G Codes:   PT G-Codes **NOT FOR INPATIENT CLASS** Functional Assessment Tool Used: Clinical Judgement Functional Limitation: Mobility: Walking and moving around Mobility: Walking and Moving Around Current Status [id number]): At least 1 percent but less than 20 percent impaired, limited or restricted Mobility: Walking and Moving Around Goal Status [phone]): At least 1 percent but less than 20 percent impaired, limited or restricted    Collie Siad PT, DPT  Pager: [phone] Phone: [phone] 05/30/2016, 4:30 PM

## 2016-05-30 NOTE — Op Note (Signed)
[name], [name]                ACCOUNT NO.:  [id number]  MEDICAL RECORD NO.:  [id number]  LOCATION:  [facility]                         FACILITY:  [facility]  PHYSICIAN:  Alacia Rehmann D. Rolena Infante, M.D. DATE OF BIRTH:  03-08-1961  DATE OF PROCEDURE:  05/30/2016 DATE OF DISCHARGE:                              OPERATIVE REPORT   PREOPERATIVE DIAGNOSIS:  Lumbar spinal stenosis with disk herniation, L2- 3.  POSTOPERATIVE DIAGNOSIS:  Lumbar spinal stenosis with disk herniation, L2-3.  OPERATIVE PROCEDURE:  L2-3 central decompression and diskectomy.  FIRST ASSISTANT:  [facility].  COMPLICATIONS:  None.  CONDITION:  Stable.  HISTORY:  This is a very pleasant 55 year old gentleman who has had progressive debilitating back, buttock and now bilateral lower extremity pain.  He also has numbness and dysesthesias.  The patient failed conservative management consisting of physiotherapy, injection therapy and elected to proceed with surgery.  All appropriate risks, benefits and alternatives were discussed with the patient.  Consent was obtained. First assistant for the case was [facility], my PA.  OPERATIVE NOTE:  The patient was brought to the operating room and placed supine on the operating table.  After successful induction of general anesthesia and endotracheal intubation, TEDs, SCDs were applied. At this time, I noted he had very enlarged scrotal area consistent with a hydrocele.  Plan would be to speak with his wife postoperatively by urological followup.  He was turned prone onto the Wilson frame and all bony prominences were well padded.  The back was prepped and draped in a standard fashion.  Time-out was taken to confirming the patient, procedure, and all other pertinent important data.  Once this was completed, two needles were placed into the back and I confirmed the level of the L2-3 disk space.  Once this was done, I mapped out the incision site midline, spanning the 2-3 disk space and  infiltrated with 0.25% Marcaine.  Midline incision was made and sharp dissection was carried out down to the deep fascia.  The deep fascia was sharply incised using Cobb and Bovie, stripped the paraspinal muscles to expose the L2 and L3 spinous process, lamina and the facet complex.  Care was taken not to violate the facet capsule itself.  Self-retaining retractors were placed into the wound and a Penfield 4 was placed onto the L2 lamina.  A second intraoperative x-ray was taken to confirming the L2 lamina site.  Once this was done, I used double-action Leksell rongeur to remove the inferior third of the L2 spinous process and the superior third of the L3 spinous process.  I then used a 3-mm Kerrison rongeur to perform a laminotomy of L2.  Penfield 4 was used to dissect through the central raphe of the ligamentum flavum and the 2 and 3-mm Kerrison punch were used to remove the ligamentum flavum.  The thecal sac was protected with neural patties.  I then went down into the left lateral recess and identified the L3 nerve root.  I also identified the L3 pedicle.  Cottonoid was placed to gently retract and protect the nerve root.  The cottonoid was also placed superiorly, which allowed me to visualize the posterolateral aspect of the disk.  Using a fine nerve hook, I was able to deliver multiple fragments of the disk material consistent with what was seen on the preoperative MRI.  Once I had removed this, I then identified the annular defect, enlarged it with a 15-blade scalpel and then used my micropituitary rongeurs to remove loose fragments of disk material within the disk itself.  Epstein curette was used to remove the thickened annulus.  At this point, I could now take my [facility] and pass it underneath the thecal sac circumferentially at the level of the disk space.  I then checked into the right lateral recess.  I could palpate the right L3 pedicle into the L2 foramen.  There  was no significant ongoing neural compression.  Satisfied with the decompression centrally and in the lateral recess as well as the diskectomy.  I also took my Surveyor, quantity and passed it into the left.  I could visualize the left L3 nerve root, left L3 pedicle and up into the L2 foramen.  At this point, I had adequately decompressed the stenosis and removed the disk herniation.  The wound was then copiously irrigated with normal saline and hemostasis was obtained using bipolar electrocautery as well as FloSeal.  Thrombin-soaked Gelfoam patty was placed over the laminotomy site and the wound was closed in a layered fashion with a running #1 Stratafix barbed suture, 2-0 interrupted Vicryl and a 3-0 Monocryl.  Steri-Strips and dry dressing were applied and the patient was extubated, transferred to the PACU without incident. At the end of the case, all needle and sponge counts were correct. There were no adverse intraoperative events.  No evidence of any CSF leak at the conclusion of the case.     Ilaria Much D. Rolena Infante, M.D.     DDB/MEDQ  D:  05/30/2016  T:  05/30/2016  Job:  [id number]

## 2016-05-30 NOTE — Progress Notes (Signed)
Patient alert and oriented, mae's well, voiding adequate amount of urine, swallowing without difficulty, no c/o pain. Patient discharged home with family. Script and discharged instructions given to patient. Patient and family stated understanding of d/c instructions given and has an appointment with MD. 

## 2016-05-30 NOTE — H&P (Signed)
History of Present Illness The patient is a 55 year old male who presents for a follow-up for H & P. The patient is scheduled for a lumbar decompression/discectomy L2-3 to be performed by Dr. Duane Lope D. Rolena Infante, MD at [facility] on 05-30-16 . Please see the hospital record for complete dictated history and physical. the pt has a hx of bladder cancer in 2004. recieved tx until 2008. He now has regular check ups. Pt only has one kidney because of kidney cancer. He reports "dipping tobacco" but says he is going to stop.  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)   Allergies  Amoxicillin *PENICILLINS*  Insect Stings  Allergies Reconciled   Family History Cancer  brother Cerebrovascular Accident  father and child Congestive Heart Failure  father Diabetes Mellitus  father Heart Disease  father  Social History Tobacco use  former smoker; smoke(d) 1 1/2 pack(s) per day; uses 1 1/2 can(s) smokeless per week Tobacco / smoke exposure  no Alcohol use  current drinker; drinks beer and wine; only occasionally per week Children  5 or more Current work status  working full time Drug/Alcohol Rehab (Currently)  no Drug/Alcohol Rehab (Previously)  no Exercise  Exercises rarely; does running / walking Illicit drug use  no Living situation  live with spouse Marital status  married Number of flights of stairs before winded  2-3 Pain Contract  no  Medication History  Tylenol (325MG  Tablet, Oral) Active. Timolol Maleate (0.5% Solution, Ophthalmic) Active. (qd) Levothyroxine Sodium (50MCG Tablet, Oral) Active. Medications Reconciled  Past Surgical History  Appendectomy  Kidney Removal  right Tonsillectomy  Vasectomy   Other Problems  Cancer  Hypothyroidism   Vitals 05/25/2016 8:00 AM Weight: 282 lb Height: 74.5in Body Surface Area: 2.53  m Body Mass Index: 35.72 kg/m  Temp.: 98.22F(Oral)  BP: 159/97 (Sitting, Left Arm, Standard)  General General Appearance-Not in acute distress. Orientation-Oriented X3. Build & Nutrition-Well nourished and Well developed.  Integumentary General Characteristics Surgical Scars - no surgical scar evidence of previous lumbar surgery. Lumbar Spine-Skin examination of the lumbar spine is without deformity, skin lesions, lacerations or abrasions.  Chest and Lung Exam Auscultation Breath sounds - Normal and Clear.  Cardiovascular Auscultation Rhythm - Regular rate and rhythm.  Abdomen Palpation/Percussion Palpation and Percussion of the abdomen reveal - Soft, Non Tender and No Rebound tenderness.  Peripheral Vascular Lower Extremity Palpation - Posterior tibial pulse - Bilateral - 2+. Dorsalis pedis pulse - Bilateral - 2+.  Neurologic Sensation Lower Extremity - Bilateral - sensation is intact in the lower extremity. Reflexes Patellar Reflex - Bilateral - 2+. Achilles Reflex - Bilateral - 2+. Clonus - Bilateral - clonus not present. Hoffman's Sign - Bilateral - Hoffman's sign not present. Testing Seated Straight Leg Raise - Bilateral - Seated straight leg raise negative.  Musculoskeletal Spine/Ribs/Pelvis  Lumbosacral Spine: Inspection and Palpation - Tenderness - right lumbar paraspinals tender to palpation and right buttock is tender to palpation. Strength and Tone: Strength - Hip Flexion - Left - 5/5. Right - 4/5. Knee Extension - Bilateral - 5/5. Knee Flexion - Bilateral - 5/5. Ankle Dorsiflexion - Bilateral - 5/5. Ankle Plantarflexion - Bilateral - 5/5. Heel walk - Bilateral - able to heel walk without difficulty. Toe Walk - Bilateral - able to walk on toes without difficulty. Heel-Toe Walk - Bilateral - able to heel-toe walk without difficulty. ROM - Flexion - moderately decreased  range of motion and painful. Extension - moderately decreased range of motion  and painful. Left Lateral Bending - moderately decreased range of motion and painful. Right Lateral Bending - moderately decreased range of motion and painful. Right Rotation - moderately decreased range of motion and painful. Left Rotation - moderately decreased range of motion and painful. Pain - . Lumbosacral Spine - Waddell's Signs - no Waddell's signs present. Lower Extremity Range of Motion - No true hip, knee or ankle pain with range of motion. Gait and Station - [employer] - no assistive devices.    Assessment & Plan  Posterior Lumbar Decompression/disectomy: Risks of surgery include infection, bleeding, nerve damage, death, stroke, paralysis, failure to heal, need for further surgery, ongoing or worse pain, need for further surgery, CSF leak, loss of bowel or bladder, and recurrent disc herniation or Stenosis which would necessitate need for further surgery.  Goal Of Surgery: Discussed that goal of surgery is to reduce pain and improve function and quality of life. Patient is aware that despite all appropriate treatment that there pain and function could be the same, worse, or different.  Assessments Transcription Very pleasant 55 year old male patient that is scheduled to have a lumbar decompression and discectomy at L2-3 with Dr. Rolena Infante. The patient continues to have severe back pain, buttock pain, and leg pain primarily on the right, but he says at times it will radiate into his left. The patient has failed injections as well as physical therapy. All questions were elicited and answered today.  He did go to physical therapy and based on his significant pain, he was told that he was probably not going to do well and was discharged from it. Given the failure of PT and the injection, he would like to move forward with surgery. I think this is reasonable. I moved forward with a lumbar decompression at L2-L3 as well as a discectomy. This would remove the left-sided compression and  address the right-sided neuropathic pain as well. We have reviewed the risks which include infection, bleeding, nerve damage, death, stroke, paralysis, failure to heal, need for further surgery, ongoing or worse pain, need for fusion surgery, leak of spinal fluid. All of his questions were address. We will be planning on moving with surgery in the near future.

## 2016-05-30 NOTE — Anesthesia Postprocedure Evaluation (Signed)
Anesthesia Post Note  Patient: Marc [name]  Procedure(s) Performed: Procedure(s) (LRB): LUMBAR DECOMPRESSION DISCECTOMY L2-3 (N/A)  Patient location during evaluation: PACU Anesthesia Type: General Level of consciousness: awake and alert Pain management: pain level controlled Vital Signs Assessment: post-procedure vital signs reviewed and stable Respiratory status: spontaneous breathing, nonlabored ventilation, respiratory function stable and patient connected to nasal cannula oxygen Cardiovascular status: blood pressure returned to baseline and stable Postop Assessment: no signs of nausea or vomiting Anesthetic complications: no    Last Vitals:  Filed Vitals:   05/30/16 0646 05/30/16 1120  BP: 151/94   Pulse: 63   Temp: 36.7 C 36.5 C  Resp: 20     Last Pain:  Filed Vitals:   05/30/16 1132  PainSc: [place]

## 2016-05-30 NOTE — Anesthesia Procedure Notes (Addendum)
Procedure Name: Intubation Date/Time: 05/30/2016 8:42 AM Performed by: Scheryl Darter Pre-anesthesia Checklist: Patient identified, Emergency Drugs available, Suction available and Patient being monitored Patient Re-evaluated:Patient Re-evaluated prior to inductionOxygen Delivery Method: Circle System Utilized Preoxygenation: Pre-oxygenation with 100% oxygen Intubation Type: IV induction Ventilation: Mask ventilation without difficulty Laryngoscope Size: Miller and 3 Grade View: Grade III Tube type: Oral Tube size: 8.0 mm Number of attempts: 1 Airway Equipment and Method: Stylet and Oral airway Placement Confirmation: ETT inserted through vocal cords under direct vision,  positive ETCO2 and breath sounds checked- equal and bilateral Tube secured with: Tape Dental Injury: Teeth and Oropharynx as per pre-operative assessment  Difficulty Due To: Difficulty was anticipated, Difficult Airway- due to anterior larynx, Difficult Airway- due to large tongue, Difficult Airway- due to reduced neck mobility, Difficult Airway- due to limited oral opening and Difficult Airway- due to dentition Comments: Anterior/

## 2016-05-30 NOTE — Brief Op Note (Signed)
05/30/2016  10:53 AM  PATIENT:  Marc [name]  55 y.o. male  PRE-OPERATIVE DIAGNOSIS:  L2-3 HNP STENOSIS  POST-OPERATIVE DIAGNOSIS:  L2-3 HNP STENOSIS  PROCEDURE:  Procedure(s): LUMBAR DECOMPRESSION DISCECTOMY L2-3 (N/A)  SURGEON:  Surgeon(s) and Role:    * Melina Schools, MD - Primary  PHYSICIAN ASSISTANT:   ASSISTANTS: carmen mayo   ANESTHESIA:   general  EBL:  Total I/O In: 1500 [I.V.:1500] Out: -   BLOOD ADMINISTERED:none  DRAINS: none   LOCAL MEDICATIONS USED:  MARCAINE     SPECIMEN:  No Specimen  DISPOSITION OF SPECIMEN:  N/A  COUNTS:  YES  TOURNIQUET:  * No tourniquets in log *  DICTATION: .Other Dictation: Dictation Number [id number]  PLAN OF CARE: Admit for overnight observation  PATIENT DISPOSITION:  PACU - hemodynamically stable.

## 2016-05-30 NOTE — Transfer of Care (Signed)
Immediate Anesthesia Transfer of Care Note  Patient: Marc [name]  Procedure(s) Performed: Procedure(s): LUMBAR DECOMPRESSION DISCECTOMY L2-3 (N/A)  Patient Location: PACU  Anesthesia Type:General  Level of Consciousness: awake, alert , oriented and sedated  Airway & Oxygen Therapy: Patient Spontanous Breathing and Patient connected to nasal cannula oxygen  Post-op Assessment: Report given to RN, Post -op Vital signs reviewed and stable and Patient moving all extremities  Post vital signs: Reviewed and stable  Last Vitals:  Filed Vitals:   05/30/16 0646  BP: 151/94  Pulse: 63  Temp: 36.7 C  Resp: 20    Last Pain: There were no vitals filed for this visit.    Patients Stated Pain Goal: 5 ([id number])  Complications: No apparent anesthesia complications

## 2016-05-31 ENCOUNTER — Encounter (HOSPITAL_COMMUNITY): Payer: Self-pay | Admitting: Orthopedic Surgery

## 2016-05-31 MED FILL — Thrombin For Soln 20000 Unit: CUTANEOUS | Qty: 1 | Status: AC

## 2016-06-20 NOTE — Discharge Summary (Signed)
Physician Discharge Summary  Patient ID: MURIEL OHM MRN: [id number] DOB/AGE: [birth year] 55 y.o.  Admit date: 05/30/2016 Discharge date: 06/20/2016  Admission Diagnoses:  Lumbar DDD  Discharge Diagnoses:  Active Problems:   Back pain   Past Medical History  Diagnosis Date  . Heart murmur   . Hypothyroidism   . Chronic kidney disease     right kidney removed 2 years ago renal cell  . Cancer ([place])     renal cell 2 years ago and bladder cancer 2004     Surgeries: Procedure(s): LUMBAR DECOMPRESSION DISCECTOMY L2-3 on 05/30/2016   Consultants (if any):    Discharged Condition: Improved  Hospital Course: EBERT BLOODSWORTH is an 55 y.o. male who was admitted 05/30/2016 with a diagnosis of Lumbar DDD and went to the operating room on 05/30/2016 and underwent the above named procedures.  Pt discharged on 05/30/16  He was given perioperative antibiotics:  Anti-infectives    Start     Dose/Rate Route Frequency Ordered Stop   05/30/16 2030  vancomycin (VANCOCIN) 1,250 mg in sodium chloride 0.9 % 250 mL IVPB  Status:  Discontinued     1,250 mg 166.7 mL/hr over 90 Minutes Intravenous  Once 05/30/16 1424 05/30/16 2300   05/30/16 0700  vancomycin (VANCOCIN) 1,500 mg in sodium chloride 0.9 % 500 mL IVPB     1,500 mg 250 mL/hr over 120 Minutes Intravenous To ShortStay Surgical 05/29/16 1448 05/30/16 0945    .  He was given sequential compression devices, early ambulation, and TED for DVT prophylaxis.  He benefited maximally from the hospital stay and there were no complications.    Recent vital signs:  Filed Vitals:   05/30/16 1250 05/30/16 1700  BP: 151/92 143/94  Pulse: 73 80  Temp: 97.6 F (36.4 C) 97.7 F (36.5 C)  Resp: 20 20    Recent laboratory studies:  Lab Results  Component Value Date   HGB 16.4 05/25/2016   Lab Results  Component Value Date   WBC 6.2 05/25/2016   PLT 181 05/25/2016   No results found for: INR Lab Results  Component Value Date   NA 136  05/25/2016   K 4.5 05/25/2016   CL 107 05/25/2016   CO2 19* 05/25/2016   BUN 26* 05/25/2016   CREATININE 1.24 05/25/2016   GLUCOSE 85 05/25/2016    Discharge Medications:     Medication List    STOP taking these medications        acetaminophen 325 MG tablet  Commonly known as:  TYLENOL      TAKE these medications        levothyroxine 200 MCG tablet  Commonly known as:  SYNTHROID, LEVOTHROID  Take 200 mcg by mouth daily before breakfast.     methocarbamol 500 MG tablet  Commonly known as:  ROBAXIN  Take 1 tablet (500 mg total) by mouth 3 (three) times daily as needed for muscle spasms.     ondansetron 4 MG tablet  Commonly known as:  ZOFRAN  Take 1 tablet (4 mg total) by mouth every 8 (eight) hours as needed for nausea or vomiting.     oxyCODONE-acetaminophen 10-325 MG tablet  Commonly known as:  PERCOCET  Take 1 tablet by mouth every 4 (four) hours as needed for pain.     timolol 0.5 % ophthalmic solution  Commonly known as:  BETIMOL  Place 1 drop into the left eye 2 (two) times daily.        Diagnostic Studies:  Dg Lumbar Spine 1 View  05/30/2016  CLINICAL DATA:  Lumbar decompression and discectomy L2-L3 localization EXAM: LUMBAR SPINE - 1 VIEW COMPARISON:  None. FINDINGS: Single lateral view of the lumbar spine submitted. There is a posterior localization needle at the level of lower endplate of L1 vertebral body. A second posterior localization needle at mid L2 level IMPRESSION: Posterior localization needle at the level of lower endplate of L1. Second posterior localization needle mid level of L2 vertebral body. Electronically Signed   By: Lahoma Crocker M.D.   On: 05/30/2016 11:26   Dg Spine Portable 1 View  05/30/2016  CLINICAL DATA:  L2-3 lumbar decompression. Intraoperative localizing film. EXAM: PORTABLE SPINE - 1 VIEW COMPARISON:  Single view of the lumbar spine earlier today. FINDINGS: Clamp is identified on the L2 spinous process. Probe is directed toward the  L2-3 interspace. IMPRESSION: L2-3 localization. Electronically Signed   By: Inge Rise M.D.   On: 05/30/2016 09:41    Disposition: 01-Home or Self Care        Follow-up Information    Follow up with Dahlia Bailiff, MD. Schedule an appointment as soon as possible for a visit in 2 weeks.   Specialty:  Orthopedic Surgery   Why:  If symptoms worsen, For suture removal, For wound re-check   Contact information:   [address] [place] [address] [id number]        Signed: Valinda Hoar 06/20/2016, 8:13 AM   Agree with above

## 2017-07-24 DIAGNOSIS — N492 Inflammatory disorders of scrotum: Secondary | ICD-10-CM | POA: Insufficient documentation

## 2017-09-30 ENCOUNTER — Ambulatory Visit: Payer: Self-pay | Admitting: Urology

## 2019-04-23 ENCOUNTER — Telehealth: Payer: Self-pay | Admitting: Urology

## 2019-04-23 NOTE — Telephone Encounter (Signed)
Can schedule cystoscopy

## 2019-04-23 NOTE — Telephone Encounter (Signed)
Pt saw you at [facility] 05/2017 and had hydrocele surgery.  He wanted to make a follow up appt here.  You didn't have a follow up opening until 7/14.  Pt says he needs a cysto.  If so, I can get him an appt sooner.  Please advise.

## 2019-05-13 ENCOUNTER — Other Ambulatory Visit: Payer: Self-pay | Admitting: Urology

## 2019-05-19 ENCOUNTER — Other Ambulatory Visit: Payer: Self-pay

## 2019-05-19 ENCOUNTER — Ambulatory Visit: Payer: BC Managed Care – PPO | Admitting: Urology

## 2019-05-19 ENCOUNTER — Encounter: Payer: Self-pay | Admitting: Urology

## 2019-05-19 VITALS — BP 120/82 | HR 79 | Ht 74.5 in | Wt 251.0 lb

## 2019-05-19 DIAGNOSIS — K429 Umbilical hernia without obstruction or gangrene: Secondary | ICD-10-CM | POA: Insufficient documentation

## 2019-05-19 DIAGNOSIS — Z1211 Encounter for screening for malignant neoplasm of colon: Secondary | ICD-10-CM | POA: Insufficient documentation

## 2019-05-19 DIAGNOSIS — S39012A Strain of muscle, fascia and tendon of lower back, initial encounter: Secondary | ICD-10-CM | POA: Insufficient documentation

## 2019-05-19 DIAGNOSIS — Z8551 Personal history of malignant neoplasm of bladder: Secondary | ICD-10-CM

## 2019-05-19 DIAGNOSIS — C679 Malignant neoplasm of bladder, unspecified: Secondary | ICD-10-CM | POA: Insufficient documentation

## 2019-05-19 DIAGNOSIS — R251 Tremor, unspecified: Secondary | ICD-10-CM | POA: Insufficient documentation

## 2019-05-19 DIAGNOSIS — E7800 Pure hypercholesterolemia, unspecified: Secondary | ICD-10-CM | POA: Insufficient documentation

## 2019-05-19 DIAGNOSIS — Z2821 Immunization not carried out because of patient refusal: Secondary | ICD-10-CM | POA: Insufficient documentation

## 2019-05-19 DIAGNOSIS — Z85528 Personal history of other malignant neoplasm of kidney: Secondary | ICD-10-CM

## 2019-05-19 DIAGNOSIS — M940 Chondrocostal junction syndrome [Tietze]: Secondary | ICD-10-CM | POA: Insufficient documentation

## 2019-05-19 DIAGNOSIS — L309 Dermatitis, unspecified: Secondary | ICD-10-CM | POA: Insufficient documentation

## 2019-05-19 DIAGNOSIS — Z8042 Family history of malignant neoplasm of prostate: Secondary | ICD-10-CM | POA: Insufficient documentation

## 2019-05-19 LAB — MICROSCOPIC EXAMINATION
Bacteria, UA: NONE SEEN
Epithelial Cells (non renal): NONE SEEN /hpf (ref 0–10)
RBC: NONE SEEN /hpf (ref 0–2)

## 2019-05-19 LAB — URINALYSIS, COMPLETE
Bilirubin, UA: NEGATIVE
Glucose, UA: NEGATIVE
Ketones, UA: NEGATIVE
Leukocytes,UA: NEGATIVE
Nitrite, UA: NEGATIVE
Protein,UA: NEGATIVE
RBC, UA: NEGATIVE
Specific Gravity, UA: 1.015 (ref 1.005–1.030)
Urobilinogen, Ur: 0.2 mg/dL (ref 0.2–1.0)
pH, UA: 5.5 (ref 5.0–7.5)

## 2019-05-19 MED ORDER — LIDOCAINE HCL URETHRAL/MUCOSAL 2 % EX GEL: 1.0000 "application " | Freq: Once | CUTANEOUS | Status: DC

## 2019-05-19 NOTE — Progress Notes (Signed)
05/19/2019 12:26 PM   Delle Reining Erin Hearing Oct 21, 1961 [id number]  Referring provider: Maryland Pink, MD [address] [facility] [place], [place] [address]  Chief Complaint  Patient presents with  . Other    Establish care   Urologic history: 1.  History urothelial carcinoma bladder  -T1, high-grade urothelial carcinoma 2004  -6 week induction BCG plus maintenance  -Several bladder biopsies for mucosal erythema showed no recurrent tumor or CIS.  Last biopsy 2007  2.  History renal cell carcinoma  -Incidentally noted CTU 01/2014  -Laparoscopic right nephrectomy 02/2014; Dr. 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  History of right hydrocele  -Hydrocelectomy July 2018  -Postop abscess requiring I&D  HPI: Marc [name] is a 58 year old male who presents to re-establish urologic care.  I had previously followed him at [facility] and he was last seen in 2018.  Urologic problem list as above.  His last surveillance cystoscopy was in July 2018.  Last imaging for renal cell carcinoma was in July 2018.  He does have a stable complex renal cyst in the left kidney.  He has no bothersome lower urinary tract symptoms.  Denies gross hematuria.  Denies flank, abdominal, pelvic or scrotal pain.  He had lab work performed by his PCP last month and a comprehensive metabolic panel was unremarkable.  Creatinine was 1.2.  CBC was unremarkable.  PMH: Past Medical History:  Diagnosis Date  . Cancer ([place])    renal cell 2 years ago and bladder cancer 2004   . Chronic kidney disease    right kidney removed 2 years ago renal cell  . Heart murmur   . History of bladder cancer   . Hypothyroidism     Surgical History: Past Surgical History:  Procedure Laterality Date  . APPENDECTOMY    . BLADDER SURGERY    . HERNIA REPAIR    . kidney removed Right   . LUMBAR LAMINECTOMY/DECOMPRESSION MICRODISCECTOMY N/A 05/30/2016   Procedure: LUMBAR DECOMPRESSION DISCECTOMY L2-3;  Surgeon:  Melina Schools, MD;  Location: [facility];  Service: Orthopedics;  Laterality: N/A;  . TONSILLECTOMY      Home Medications:  Allergies as of 05/19/2019      Reactions   Bee Venom Swelling   Amoxicillin Hives   Sulfamethoxazole-trimethoprim Nausea Only   Upset stomach      Medication List       Accurate as of May 19, 2019 12:26 PM. If you have any questions, ask your nurse or doctor.        STOP taking these medications   methocarbamol 500 MG tablet Commonly known as:  Robaxin Stopped by:  Abbie Sons, MD   ondansetron 4 MG tablet Commonly known as:  Zofran Stopped by:  Abbie Sons, MD   oxyCODONE-acetaminophen 10-325 MG tablet Commonly known as:  Percocet Stopped by:  Abbie Sons, MD   timolol 0.5 % ophthalmic solution Commonly known as:  BETIMOL Stopped by:  Abbie Sons, MD     TAKE these medications   levothyroxine 200 MCG tablet Commonly known as:  SYNTHROID Take 200 mcg by mouth daily before breakfast. What changed:  Another medication with the same name was removed. Continue taking this medication, and follow the directions you see here. Changed by:  Abbie Sons, MD   timolol 0.5 % ophthalmic solution Commonly known as:  TIMOPTIC 1 drop daily.       Allergies:  Allergies  Allergen Reactions  .  Bee Venom Swelling  . Amoxicillin Hives  . Sulfamethoxazole-Trimethoprim Nausea Only    Upset stomach    Family History: No family history on file.  Social History:  reports that he quit smoking about 15 years ago. His smokeless tobacco use includes snuff. He reports current drug use. Drug: Marijuana. He reports that he does not drink alcohol.  ROS: UROLOGY Frequent Urination?: No Hard to postpone urination?: No Burning/pain with urination?: No Get up at night to urinate?: No Leakage of urine?: No Urine stream starts and stops?: No Trouble starting stream?: No Do you have to strain to urinate?: No Blood in urine?: No Urinary tract  infection?: No Sexually transmitted disease?: No Injury to kidneys or bladder?: No Painful intercourse?: No Weak stream?: No Erection problems?: No Penile pain?: No  Gastrointestinal Nausea?: No Vomiting?: No Indigestion/heartburn?: No Diarrhea?: No Constipation?: No  Constitutional Fever: No Night sweats?: No Weight loss?: No Fatigue?: No  Skin Skin rash/lesions?: No Itching?: No  Eyes Blurred vision?: No Double vision?: No  Ears/Nose/Throat Sore throat?: No Sinus problems?: No  Hematologic/Lymphatic Swollen glands?: No Easy bruising?: No  Cardiovascular Leg swelling?: No Chest pain?: No  Respiratory Cough?: No Shortness of breath?: No  Endocrine Excessive thirst?: No  Musculoskeletal Back pain?: No Joint pain?: No  Neurological Headaches?: No Dizziness?: No  Psychologic Depression?: No Anxiety?: No  Physical Exam: BP 120/82 (BP Location: Left Arm, Patient Position: Sitting, Cuff Size: Large)   Pulse 79   Ht 6' 2.5" (1.892 m)   Wt 251 lb (113.9 kg)   BMI 31.80 kg/m   Constitutional:  Alert and oriented, No acute distress. HEENT: [place] AT, moist mucus membranes.  Trachea midline, no masses. Cardiovascular: No clubbing, cyanosis, or edema. Respiratory: Normal respiratory effort, no increased work of breathing. GI: Abdomen is soft, nontender, nondistended, no abdominal masses GU: No CVA tenderness.  Penis circumcised without lesions, testes descended bilaterally without masses or tenderness. Lymph: No cervical or inguinal lymphadenopathy. Skin: No rashes, bruises or suspicious lesions. Neurologic: Grossly intact, no focal deficits, moving all 4 extremities. Psychiatric: Normal mood and affect.   Assessment & Plan:    1. Personal history of malignant neoplasm of bladder Cystoscopy was performed today.  Refer to procedure note  2.  History renal cell carcinoma 5 years out from surgery with last imaging 2 years ago.  Recommend a chest x-ray  and renal ultrasound.  He states he will have his chest x-ray performed by his PCP.  Renal ultrasound was ordered.   Abbie Sons, [place] [address] [place], [place] [address] [phone]

## 2019-05-19 NOTE — Progress Notes (Signed)
   05/19/19  CC:  Chief Complaint  Patient presents with  . Other    Establish care    HPI: History T1 urothelial carcinoma 2004 without recurrence for surveillance cystoscopy  Blood pressure 120/82, pulse 79, height 6' 2.5" (1.892 m), weight 251 lb (113.9 kg). NED. A&Ox3.   No respiratory distress   Abd soft, NT, ND Normal phallus with bilateral descended testicles  Cystoscopy Procedure Note  Patient identification was confirmed, informed consent was obtained, and patient was prepped using Betadine solution.  Lidocaine jelly was administered per urethral meatus.     Pre-Procedure: - Inspection reveals a normal caliber ureteral meatus.  Procedure: The flexible cystoscope was introduced without difficulty - No urethral strictures/lesions are present. - Moderate lateral lobe enlargement prostate  - Normal bladder neck - Bilateral ureteral orifices identified - Bladder mucosa  reveals no ulcers, tumors, or lesions - No bladder stones - No trabeculation  Retroflexion shows no abnormalities   Post-Procedure: - Patient tolerated the procedure well  Assessment/ Plan: No evidence of recurrent urothelial carcinoma.  Continue annual surveillance.  Return in about 1 year (around 05/18/2020) for Cystoscopy.  Abbie Sons, MD

## 2019-06-09 ENCOUNTER — Ambulatory Visit: Payer: BC Managed Care – PPO

## 2019-06-23 ENCOUNTER — Ambulatory Visit: Payer: Self-pay | Admitting: Urology

## 2019-09-29 ENCOUNTER — Ambulatory Visit
Admission: RE | Admit: 2019-09-29 | Discharge: 2019-09-29 | Disposition: A | Discharge status: Home / Self Care | Payer: BC Managed Care – PPO | Source: Ambulatory Visit | Attending: Urology | Admitting: Urology

## 2019-09-29 ENCOUNTER — Other Ambulatory Visit: Payer: Self-pay

## 2019-09-29 DIAGNOSIS — Z85528 Personal history of other malignant neoplasm of kidney: Secondary | ICD-10-CM | POA: Diagnosis present

## 2019-10-05 ENCOUNTER — Telehealth: Payer: Self-pay

## 2019-10-05 NOTE — Telephone Encounter (Signed)
Left pt mess to call, no DPR on file

## 2019-10-05 NOTE — Telephone Encounter (Signed)
-----   Message from Abbie Sons, MD sent at 10/04/2019  5:47 PM EDT ----- Left kidney was normal in appearance.  Follow-up as scheduled

## 2019-10-06 NOTE — Telephone Encounter (Signed)
Patient notified

## 2019-10-06 NOTE — Telephone Encounter (Signed)
Pt returned call from yesterday for [place] results

## 2020-02-14 ENCOUNTER — Ambulatory Visit: Payer: PRIVATE HEALTH INSURANCE | Attending: Internal Medicine

## 2020-02-14 DIAGNOSIS — Z23 Encounter for immunization: Secondary | ICD-10-CM

## 2020-02-14 NOTE — Progress Notes (Signed)
   Covid-19 Vaccination Clinic  Name:  Marc [name]    MRN: [id number] DOB: 11-Jan-1961  02/14/2020  Marc [name] was observed post Covid-19 immunization for 15 minutes without incident. He was provided with Vaccine Information Sheet and instruction to access the V-Safe system.   Marc [name] was instructed to call 911 with any severe reactions post vaccine: Marland Kitchen Difficulty breathing  . Swelling of face and throat  . A fast heartbeat  . A bad rash all over body  . Dizziness and weakness   Immunizations Administered    Name Date Dose VIS Date Route   Pfizer COVID-19 Vaccine 02/14/2020  1:20 PM 0.3 mL 11/20/2019 Intramuscular   Manufacturer: [place]   Lot: [id number]   [place]: [id number]

## 2020-03-08 ENCOUNTER — Ambulatory Visit: Payer: PRIVATE HEALTH INSURANCE | Attending: Internal Medicine

## 2020-03-08 DIAGNOSIS — Z23 Encounter for immunization: Secondary | ICD-10-CM

## 2020-03-08 NOTE — Progress Notes (Signed)
   Covid-19 Vaccination Clinic  Name:  Marc [name]    MRN: [id number] DOB: [birth year]  03/08/2020  Marc [name] was observed post Covid-19 immunization for 15 minutes without incident. He was provided with Vaccine Information Sheet and instruction to access the V-Safe system.   Marc [name] was instructed to call 911 with any severe reactions post vaccine: Marland Kitchen Difficulty breathing  . Swelling of face and throat  . A fast heartbeat  . A bad rash all over body  . Dizziness and weakness   Immunizations Administered    Name Date Dose VIS Date Route   Pfizer COVID-19 Vaccine 03/08/2020  9:29 AM 0.3 mL 11/20/2019 Intramuscular   Manufacturer: [place]   Lot: [id number]   [place]: [id number]

## 2020-05-20 ENCOUNTER — Encounter: Payer: Self-pay | Admitting: Urology

## 2020-05-20 ENCOUNTER — Ambulatory Visit: Payer: BC Managed Care – PPO | Admitting: Urology

## 2020-05-20 ENCOUNTER — Other Ambulatory Visit: Payer: Self-pay

## 2020-05-20 DIAGNOSIS — Z8551 Personal history of malignant neoplasm of bladder: Secondary | ICD-10-CM

## 2020-05-20 DIAGNOSIS — Z85528 Personal history of other malignant neoplasm of kidney: Secondary | ICD-10-CM

## 2020-05-20 LAB — URINALYSIS, COMPLETE
Bilirubin, UA: NEGATIVE
Glucose, UA: NEGATIVE
Ketones, UA: NEGATIVE
Leukocytes,UA: NEGATIVE
Nitrite, UA: NEGATIVE
Protein,UA: NEGATIVE
RBC, UA: NEGATIVE
Specific Gravity, UA: 1.025 (ref 1.005–1.030)
Urobilinogen, Ur: 0.2 mg/dL (ref 0.2–1.0)
pH, UA: 5 (ref 5.0–7.5)

## 2020-05-20 LAB — MICROSCOPIC EXAMINATION
Bacteria, UA: NONE SEEN
Epithelial Cells (non renal): NONE SEEN /hpf (ref 0–10)
RBC: NONE SEEN /hpf (ref 0–2)

## 2020-05-20 NOTE — Progress Notes (Signed)
° °  05/20/20  CC:  Chief Complaint  Patient presents with   Cysto    Urologic history: 1.  History urothelial carcinoma bladder             -T1, high-grade urothelial carcinoma 2004             -6 week induction BCG plus maintenance             -Several bladder biopsies for mucosal erythema showed no recurrent tumor or CIS.  Last biopsy 2007  2.  History renal cell carcinoma             -Incidentally noted CTU 01/2014             -Laparoscopic right nephrectomy 02/2014; Dr. Lottie Rater             -pT1b clear cell RCC [place]an 2/4; negative margins              3.  History of right hydrocele             -Hydrocelectomy July 2018             -Postop abscess requiring I&D  HPI: 59 y.o. male presents for annual surveillance cystoscopy.  He has no complaints.  Denies gross hematuria  NED. A&Ox3.   No respiratory distress   Abd soft, NT, ND Normal phallus with bilateral descended testicles  Cystoscopy Procedure Note  Patient identification was confirmed, informed consent was obtained, and patient was prepped using Betadine solution.  Lidocaine jelly was administered per urethral meatus.     Pre-Procedure: - Inspection reveals a normal caliber urethral meatus.  Procedure: The flexible cystoscope was introduced without difficulty - No urethral strictures/lesions are present. - Moderate lateral lobe enlargement prostate  - Normal bladder neck - Bilateral ureteral orifices identified - Bladder mucosa  reveals no ulcers, tumors, or lesions - No bladder stones - No trabeculation  Retroflexion shows no abnormalities   Post-Procedure: - Patient tolerated the procedure well  Assessment/ Plan: -No evidence recurrent tumor -Surveillance cystoscopy 1 year -RUS last year with normal left kidney -Follow-up RCC surveillance: Imaging as needed; annual met B, LFTs and CBC   Abbie Sons, MD

## 2020-06-23 ENCOUNTER — Other Ambulatory Visit: Payer: Self-pay | Admitting: Family Medicine

## 2020-06-23 DIAGNOSIS — M79604 Pain in right leg: Secondary | ICD-10-CM

## 2020-06-27 ENCOUNTER — Ambulatory Visit
Admission: RE | Admit: 2020-06-27 | Discharge: 2020-06-27 | Disposition: A | Discharge status: Home / Self Care | Payer: BC Managed Care – PPO | Source: Ambulatory Visit | Attending: Family Medicine | Admitting: Family Medicine

## 2020-06-27 ENCOUNTER — Other Ambulatory Visit: Payer: Self-pay

## 2020-06-27 DIAGNOSIS — M79604 Pain in right leg: Secondary | ICD-10-CM | POA: Diagnosis present

## 2020-06-27 DIAGNOSIS — M7989 Other specified soft tissue disorders: Secondary | ICD-10-CM | POA: Insufficient documentation

## 2021-01-27 ENCOUNTER — Other Ambulatory Visit: Payer: Self-pay

## 2021-01-27 ENCOUNTER — Emergency Department: Payer: BC Managed Care – PPO

## 2021-01-27 ENCOUNTER — Ambulatory Visit
Admission: EM | Admit: 2021-01-27 | Discharge: 2021-01-27 | Disposition: A | Discharge status: Home / Self Care | Payer: BC Managed Care – PPO | Attending: Family Medicine | Admitting: Family Medicine

## 2021-01-27 ENCOUNTER — Encounter: Payer: Self-pay | Admitting: Emergency Medicine

## 2021-01-27 DIAGNOSIS — I214 Non-ST elevation (NSTEMI) myocardial infarction: Secondary | ICD-10-CM | POA: Diagnosis not present

## 2021-01-27 DIAGNOSIS — F17220 Nicotine dependence, chewing tobacco, uncomplicated: Secondary | ICD-10-CM | POA: Diagnosis present

## 2021-01-27 DIAGNOSIS — I2510 Atherosclerotic heart disease of native coronary artery without angina pectoris: Secondary | ICD-10-CM | POA: Diagnosis present

## 2021-01-27 DIAGNOSIS — Z20822 Contact with and (suspected) exposure to covid-19: Secondary | ICD-10-CM | POA: Diagnosis present

## 2021-01-27 DIAGNOSIS — E039 Hypothyroidism, unspecified: Secondary | ICD-10-CM | POA: Diagnosis present

## 2021-01-27 DIAGNOSIS — R079 Chest pain, unspecified: Secondary | ICD-10-CM | POA: Insufficient documentation

## 2021-01-27 DIAGNOSIS — I10 Essential (primary) hypertension: Secondary | ICD-10-CM | POA: Diagnosis present

## 2021-01-27 DIAGNOSIS — E7800 Pure hypercholesterolemia, unspecified: Secondary | ICD-10-CM | POA: Diagnosis present

## 2021-01-27 DIAGNOSIS — Z79899 Other long term (current) drug therapy: Secondary | ICD-10-CM

## 2021-01-27 DIAGNOSIS — Z85528 Personal history of other malignant neoplasm of kidney: Secondary | ICD-10-CM

## 2021-01-27 DIAGNOSIS — Z79890 Hormone replacement therapy (postmenopausal): Secondary | ICD-10-CM

## 2021-01-27 DIAGNOSIS — E785 Hyperlipidemia, unspecified: Secondary | ICD-10-CM | POA: Diagnosis present

## 2021-01-27 DIAGNOSIS — R350 Frequency of micturition: Secondary | ICD-10-CM | POA: Diagnosis present

## 2021-01-27 DIAGNOSIS — Z8551 Personal history of malignant neoplasm of bladder: Secondary | ICD-10-CM

## 2021-01-27 MED ORDER — LIDOCAINE VISCOUS HCL 2 % MT SOLN
15.0000 mL | Freq: Once | OROMUCOSAL | Status: AC
  Administered 2021-01-27: 15 mL via ORAL

## 2021-01-27 MED ORDER — ALUM & MAG HYDROXIDE-SIMETH 200-200-20 MG/5ML PO SUSP
30.0000 mL | Freq: Once | ORAL | Status: AC
  Administered 2021-01-27: 30 mL via ORAL

## 2021-01-27 MED ORDER — SUCRALFATE 1 G PO TABS: 1.0000 g | ORAL_TABLET | Freq: Three times a day (TID) | ORAL | 0 refills | Status: DC

## 2021-01-27 NOTE — Discharge Instructions (Addendum)
Your EKG was not concerning  I am prescribing Carafate. Take this as prescribed Prilosec 40 mg daily. Maalox or Mylanta as needed.  ER for worsening symptoms. Otherwise see your doctor Monday.

## 2021-01-27 NOTE — ED Triage Notes (Signed)
Pt presents to ER c/o chest pain that started Sunday and has been happening intermittently.  Pt seen by PCP and treated for GERD without relief.  Pt states pain in in middle of his chest and radiates to right arm.  Pt states pain is burning in nature and endorses associated SOB.  Pt A&O x4 at this time. NAD noted at this time.

## 2021-01-27 NOTE — ED Triage Notes (Signed)
Pt presents today with chest pain that began as heartburn last Sunday, constant chest pain x 3 hours, denies n/v/d.

## 2021-01-28 ENCOUNTER — Emergency Department: Payer: BC Managed Care – PPO

## 2021-01-28 ENCOUNTER — Encounter: Payer: Self-pay | Admitting: Internal Medicine

## 2021-01-28 ENCOUNTER — Inpatient Hospital Stay
Admission: EM | Admit: 2021-01-28 | Discharge: 2021-01-31 | DRG: 247 | Disposition: A | Discharge status: Home / Self Care | Payer: BC Managed Care – PPO | Attending: Internal Medicine | Admitting: Internal Medicine

## 2021-01-28 ENCOUNTER — Other Ambulatory Visit: Payer: Self-pay

## 2021-01-28 DIAGNOSIS — Z85528 Personal history of other malignant neoplasm of kidney: Secondary | ICD-10-CM | POA: Insufficient documentation

## 2021-01-28 DIAGNOSIS — I214 Non-ST elevation (NSTEMI) myocardial infarction: Secondary | ICD-10-CM | POA: Diagnosis present

## 2021-01-28 DIAGNOSIS — Z79899 Other long term (current) drug therapy: Secondary | ICD-10-CM | POA: Diagnosis not present

## 2021-01-28 DIAGNOSIS — E7800 Pure hypercholesterolemia, unspecified: Secondary | ICD-10-CM | POA: Diagnosis present

## 2021-01-28 DIAGNOSIS — I10 Essential (primary) hypertension: Secondary | ICD-10-CM | POA: Diagnosis present

## 2021-01-28 DIAGNOSIS — E039 Hypothyroidism, unspecified: Secondary | ICD-10-CM | POA: Diagnosis present

## 2021-01-28 DIAGNOSIS — R350 Frequency of micturition: Secondary | ICD-10-CM | POA: Diagnosis present

## 2021-01-28 DIAGNOSIS — I2510 Atherosclerotic heart disease of native coronary artery without angina pectoris: Secondary | ICD-10-CM | POA: Diagnosis present

## 2021-01-28 DIAGNOSIS — Z79890 Hormone replacement therapy (postmenopausal): Secondary | ICD-10-CM | POA: Diagnosis not present

## 2021-01-28 DIAGNOSIS — F17220 Nicotine dependence, chewing tobacco, uncomplicated: Secondary | ICD-10-CM | POA: Diagnosis present

## 2021-01-28 DIAGNOSIS — Z20822 Contact with and (suspected) exposure to covid-19: Secondary | ICD-10-CM | POA: Diagnosis present

## 2021-01-28 DIAGNOSIS — E785 Hyperlipidemia, unspecified: Secondary | ICD-10-CM | POA: Diagnosis present

## 2021-01-28 DIAGNOSIS — Z8551 Personal history of malignant neoplasm of bladder: Secondary | ICD-10-CM | POA: Diagnosis not present

## 2021-01-28 LAB — CBC
HCT: 44.7 % (ref 39.0–52.0)
HCT: 42.5 % (ref 39.0–52.0)
Hemoglobin: 15.3 g/dL (ref 13.0–17.0)
Hemoglobin: 14.4 g/dL (ref 13.0–17.0)
MCH: 30.4 pg (ref 26.0–34.0)
MCH: 30.6 pg (ref 26.0–34.0)
MCHC: 34.2 g/dL (ref 30.0–36.0)
MCHC: 33.9 g/dL (ref 30.0–36.0)
MCV: 88.7 fL (ref 80.0–100.0)
MCV: 90.4 fL (ref 80.0–100.0)
Platelets: 194 10*3/uL (ref 150–400)
Platelets: 214 10*3/uL (ref 150–400)
RBC: 5.04 MIL/uL (ref 4.22–5.81)
RBC: 4.7 MIL/uL (ref 4.22–5.81)
RDW: 12.3 % (ref 11.5–15.5)
RDW: 12.2 % (ref 11.5–15.5)
WBC: 11.6 10*3/uL — ABNORMAL HIGH (ref 4.0–10.5)
WBC: 12.9 10*3/uL — ABNORMAL HIGH (ref 4.0–10.5)
nRBC: 0 % (ref 0.0–0.2)
nRBC: 0 % (ref 0.0–0.2)

## 2021-01-28 LAB — C-REACTIVE PROTEIN: CRP: 0.5 mg/dL (ref <1.0)

## 2021-01-28 LAB — BASIC METABOLIC PANEL
Anion gap: 10 (ref 5–15)
Anion gap: 8 (ref 5–15)
BUN: 27 mg/dL — ABNORMAL HIGH (ref 8–23)
BUN: 25 mg/dL — ABNORMAL HIGH (ref 8–23)
CO2: 22 mmol/L (ref 22–32)
CO2: 25 mmol/L (ref 22–32)
Calcium: 9.1 mg/dL (ref 8.9–10.3)
Calcium: 8.8 mg/dL — ABNORMAL LOW (ref 8.9–10.3)
Chloride: 107 mmol/L (ref 98–111)
Chloride: 105 mmol/L (ref 98–111)
Creatinine, Ser: 1.12 mg/dL (ref 0.61–1.24)
Creatinine, Ser: 1.18 mg/dL (ref 0.61–1.24)
GFR, Estimated: >60 mL/min (ref >60)
GFR, Estimated: >60 mL/min (ref >60)
Glucose, Bld: 112 mg/dL — ABNORMAL HIGH (ref 70–99)
Glucose, Bld: 107 mg/dL — ABNORMAL HIGH (ref 70–99)
Potassium: 3.8 mmol/L (ref 3.5–5.1)
Potassium: 4 mmol/L (ref 3.5–5.1)
Sodium: 139 mmol/L (ref 135–145)
Sodium: 138 mmol/L (ref 135–145)

## 2021-01-28 LAB — LIPID PANEL
Cholesterol: 164 mg/dL (ref 0–200)
HDL: 45 mg/dL (ref >40)
LDL Cholesterol: 109 mg/dL — ABNORMAL HIGH (ref 0–99)
Total CHOL/HDL Ratio: 3.6 RATIO
Triglycerides: 49 mg/dL (ref <150)
VLDL: 10 mg/dL (ref 0–40)

## 2021-01-28 LAB — PROTIME-INR
INR: 1 (ref 0.8–1.2)
INR: 1.1 (ref 0.8–1.2)
Prothrombin Time: 13.2 seconds (ref 11.4–15.2)
Prothrombin Time: 13.8 seconds (ref 11.4–15.2)

## 2021-01-28 LAB — RESP PANEL BY RT-PCR (FLU A&B, COVID) ARPGX2
Influenza A by PCR: NEGATIVE
Influenza B by PCR: NEGATIVE
SARS Coronavirus 2 by RT PCR: NEGATIVE

## 2021-01-28 LAB — HEMOGLOBIN A1C
Hgb A1c MFr Bld: 5.4 % (ref 4.8–5.6)
Mean Plasma Glucose: 108.28 mg/dL

## 2021-01-28 LAB — APTT: aPTT: 31 seconds (ref 24–36)

## 2021-01-28 LAB — TROPONIN I (HIGH SENSITIVITY)
Troponin I (High Sensitivity): 5843 ng/L (ref <18)
Troponin I (High Sensitivity): 8178 ng/L (ref <18)
Troponin I (High Sensitivity): 9856 ng/L (ref <18)
Troponin I (High Sensitivity): 12555 ng/L (ref <18)
Troponin I (High Sensitivity): 13572 ng/L (ref <18)

## 2021-01-28 LAB — HIV ANTIBODY (ROUTINE TESTING W REFLEX): HIV Screen 4th Generation wRfx: NONREACTIVE

## 2021-01-28 LAB — HEPARIN LEVEL (UNFRACTIONATED)
Heparin Unfractionated: >2.2 IU/mL — ABNORMAL HIGH (ref 0.30–0.70)
Heparin Unfractionated: 0.73 IU/mL — ABNORMAL HIGH (ref 0.30–0.70)
Heparin Unfractionated: 0.37 IU/mL (ref 0.30–0.70)

## 2021-01-28 LAB — T4, FREE: Free T4: 1.12 ng/dL (ref 0.61–1.12)

## 2021-01-28 LAB — TSH: TSH: 1.903 u[IU]/mL (ref 0.350–4.500)

## 2021-01-28 LAB — BRAIN NATRIURETIC PEPTIDE: B Natriuretic Peptide: 66.8 pg/mL (ref 0.0–100.0)

## 2021-01-28 MED ORDER — CLOPIDOGREL BISULFATE 75 MG PO TABS
300.0000 mg | ORAL_TABLET | Freq: Once | ORAL | Status: AC
  Administered 2021-01-28: 300 mg via ORAL
  Filled 2021-01-28: qty 4

## 2021-01-28 MED ORDER — CLOPIDOGREL BISULFATE 75 MG PO TABS
75.0000 mg | ORAL_TABLET | Freq: Every day | ORAL | Status: DC
  Administered 2021-01-28 – 2021-01-29 (×2): 75 mg via ORAL
  Filled 2021-01-28 (×2): qty 1

## 2021-01-28 MED ORDER — ONDANSETRON HCL 4 MG/2ML IJ SOLN
4.0000 mg | Freq: Once | INTRAMUSCULAR | Status: AC
  Administered 2021-01-28: 4 mg via INTRAVENOUS
  Filled 2021-01-28: qty 2

## 2021-01-28 MED ORDER — ASPIRIN EC 81 MG PO TBEC
81.0000 mg | DELAYED_RELEASE_TABLET | Freq: Every day | ORAL | Status: DC
  Administered 2021-01-29: 81 mg via ORAL
  Filled 2021-01-28: qty 1

## 2021-01-28 MED ORDER — ONDANSETRON HCL 4 MG/2ML IJ SOLN: 4.0000 mg | Freq: Four times a day (QID) | INTRAMUSCULAR | Status: DC | PRN

## 2021-01-28 MED ORDER — HEPARIN BOLUS VIA INFUSION
4000.0000 [IU] | Freq: Once | INTRAVENOUS | Status: AC
  Administered 2021-01-28: 4000 [IU] via INTRAVENOUS
  Filled 2021-01-28: qty 4000

## 2021-01-28 MED ORDER — METOPROLOL TARTRATE 25 MG PO TABS
12.5000 mg | ORAL_TABLET | Freq: Two times a day (BID) | ORAL | Status: DC
  Administered 2021-01-28 (×3): 12.5 mg via ORAL
  Filled 2021-01-28 (×3): qty 1

## 2021-01-28 MED ORDER — ASPIRIN 81 MG PO CHEW
324.0000 mg | CHEWABLE_TABLET | Freq: Once | ORAL | Status: AC
  Administered 2021-01-28: 324 mg via ORAL
  Filled 2021-01-28: qty 4

## 2021-01-28 MED ORDER — ATORVASTATIN CALCIUM 80 MG PO TABS
80.0000 mg | ORAL_TABLET | Freq: Every day | ORAL | Status: DC
  Administered 2021-01-28 – 2021-01-31 (×4): 80 mg via ORAL
  Filled 2021-01-28 (×2): qty 1
  Filled 2021-01-28 (×2): qty 4

## 2021-01-28 MED ORDER — ACETAMINOPHEN 325 MG PO TABS
650.0000 mg | ORAL_TABLET | ORAL | Status: DC | PRN
  Administered 2021-01-28 – 2021-01-30 (×2): 650 mg via ORAL
  Filled 2021-01-28 (×3): qty 2

## 2021-01-28 MED ORDER — HEPARIN (PORCINE) 25000 UT/250ML-% IV SOLN
1800.0000 [IU]/h | INTRAVENOUS | Status: DC
  Administered 2021-01-28: 01:00:00 1500 [IU]/h via INTRAVENOUS
  Administered 2021-01-29 – 2021-01-30 (×2): 1400 [IU]/h via INTRAVENOUS
  Filled 2021-01-28 (×4): qty 250

## 2021-01-28 MED ORDER — MORPHINE SULFATE (PF) 4 MG/ML IV SOLN
4.0000 mg | Freq: Once | INTRAVENOUS | Status: AC
  Administered 2021-01-28: 4 mg via INTRAVENOUS
  Filled 2021-01-28: qty 1

## 2021-01-28 MED ORDER — DIPHENHYDRAMINE HCL 25 MG PO CAPS
25.0000 mg | ORAL_CAPSULE | Freq: Once | ORAL | Status: AC
  Administered 2021-01-28: 25 mg via ORAL
  Filled 2021-01-28: qty 1

## 2021-01-28 MED ORDER — SODIUM CHLORIDE 0.9% FLUSH
3.0000 mL | Freq: Two times a day (BID) | INTRAVENOUS | Status: DC
  Administered 2021-01-29: 3 mL via INTRAVENOUS

## 2021-01-28 MED ORDER — SODIUM CHLORIDE 0.9 % IV SOLN: INTRAVENOUS | Status: AC

## 2021-01-28 MED ORDER — NITROGLYCERIN 0.4 MG SL SUBL: 0.4000 mg | SUBLINGUAL_TABLET | SUBLINGUAL | Status: DC | PRN

## 2021-01-28 NOTE — ED Notes (Signed)
Lab called. Troponin critical value of 5,843. Dr. Alfred Levins. Notified. Agricultural consultant notified

## 2021-01-28 NOTE — ED Notes (Signed)
Cardiologist at bedside.  

## 2021-01-28 NOTE — ED Provider Notes (Signed)
[facility] Emergency Department Provider Note ____________________________________________   Event Date/Time   First MD Initiated Contact with Patient 01/28/21 0019     (approximate)  I have reviewed the triage vital signs and the nursing notes.   HISTORY  Chief Complaint Chest Pain    HPI Marc [name] is a 60 y.o. male with PMH as noted below but no prior cardiac history who presents with chest pain, acute onset 5 days ago, intermittent since then, but constant for the last 12 hours today.  He describes it as pressure-like and in the lower chest and epigastric area.  It radiates to his right arm.  He reports some generalized weakness and shortness of breath with it.  He has no nausea or vomiting.  He states that he received multiple medications for possible GERD without any relief.   Past Medical History:  Diagnosis Date  . Cancer ([place])    renal cell 2 years ago and bladder cancer 2004   . Chronic kidney disease    right kidney removed 2 years ago renal cell  . Heart murmur   . History of bladder cancer   . Hypothyroidism     Patient Active Problem List   Diagnosis Date Noted  . History of renal cell cancer 01/28/2021  . NSTEMI (non-ST elevated myocardial infarction) ([place]) 01/28/2021  . Umbilical hernia 02/40/9735  . Encounter for screening for malignant neoplasm of colon 05/19/2019  . Malignant neoplasm of bladder ([place]) 05/19/2019  . Low back strain 05/19/2019  . Influenza vaccination declined 05/19/2019  . Hypercholesteremia 05/19/2019  . Has a tremor 05/19/2019  . Family history of malignant neoplasm of prostate 05/19/2019  . Costal chondritis 05/19/2019  . Acute dermatitis 05/19/2019  . Scrotal abscess 07/24/2017  . LBP (low back pain) 05/30/2016  . Radicular low back pain 05/30/2016  . Back pain 05/30/2016  . Abnormal ECG 05/22/2016  . Hypothyroidism 05/31/2015  . Hydrocele 02/08/2014  . Renal cell carcinoma ([place]) 01/29/2014  .  Malignant neoplasm of kidney excluding renal pelvis ([place]) 01/29/2014  . History of bladder cancer 12/08/2012    Past Surgical History:  Procedure Laterality Date  . APPENDECTOMY    . BLADDER SURGERY    . HERNIA REPAIR    . kidney removed Right   . LUMBAR LAMINECTOMY/DECOMPRESSION MICRODISCECTOMY N/A 05/30/2016   Procedure: LUMBAR DECOMPRESSION DISCECTOMY L2-3;  Surgeon: Melina Schools, MD;  Location: [facility];  Service: Orthopedics;  Laterality: N/A;  . TONSILLECTOMY      Prior to Admission medications   Medication Sig Start Date End Date Taking? Authorizing Provider  brimonidine (ALPHAGAN) 0.2 % ophthalmic solution Apply to eye. 02/23/20  Yes [provider]  dorzolamide-timolol (COSOPT) 22.3-6.8 MG/ML ophthalmic solution INSTILL 1 DROP IN LEFT EYE TWICE DAILY 01/15/20  Yes [provider]  omeprazole (PRILOSEC) 40 MG capsule Take by mouth. 01/26/21 01/26/22 Yes [provider]  levothyroxine (SYNTHROID) 200 MCG tablet Take 200 mcg by mouth daily before breakfast.  01/16/19 01/16/20  [provider]  rosuvastatin (CRESTOR) 10 MG tablet Take 1 tablet by mouth daily. 06/22/20 06/22/21  [provider]  sucralfate (CARAFATE) 1 g tablet Take 1 tablet (1 g total) by mouth 4 (four) times daily -  with meals and at bedtime for 14 days. 01/27/21 02/10/21  Loura Halt A, NP  timolol (TIMOPTIC) 0.5 % ophthalmic solution 1 drop daily.  02/27/19   [provider]    Allergies Bee venom, Amoxicillin, and Sulfamethoxazole-trimethoprim  History  reviewed. No pertinent family history.  Social History Social History   Tobacco Use  . Smoking status: Former Smoker    Quit date: 12/11/2003    Years since quitting: 17.1  . Smokeless tobacco: Current User    Types: Snuff  Vaping Use  . Vaping Use: Never used  Substance Use Topics  . Alcohol use: No  . Drug use: Yes    Types: Marijuana    Comment: not even a joint a day    Review of Systems  Constitutional:  No fever. Eyes: No visual changes. ENT: No sore throat. Cardiovascular: Positive for chest pain. Respiratory: Positive for mild shortness of breath. Gastrointestinal: No vomiting or diarrhea.  Genitourinary: Negative for dysuria.  Musculoskeletal: Negative for back pain. Skin: Negative for rash. Neurological: Negative for headache.   ____________________________________________   PHYSICAL EXAM:  VITAL SIGNS: ED Triage Vitals  Enc Vitals Group     BP 01/27/21 2317 (!) 140/101     Pulse Rate 01/27/21 2317 74     Resp 01/27/21 2317 18     Temp 01/27/21 2317 97.6 F (36.4 C)     Temp Source 01/27/21 2317 Oral     SpO2 01/27/21 2317 94 %     Weight 01/27/21 2321 261 lb (118.4 kg)     Height 01/27/21 2321 6\' 2"  (1.88 m)     Head Circumference --      Peak Flow --      Pain Score 01/27/21 2317 7     Pain Loc --      Pain Edu? --      Excl. in [place]? --     Constitutional: Alert and oriented.  Relatively well appearing, in no acute distress. Eyes: Conjunctivae are normal.  Head: Atraumatic. Nose: No congestion/rhinnorhea. Mouth/Throat: Mucous membranes are moist.   Neck: Normal range of motion.  Cardiovascular: Normal rate, regular rhythm. Grossly normal heart sounds.  Good peripheral circulation. Respiratory: Normal respiratory effort.  No retractions. Lungs CTAB. Gastrointestinal: No distention.  Musculoskeletal: No lower extremity edema.  Extremities warm and well perfused.  Neurologic:  Normal speech and language. No gross focal neurologic deficits are appreciated.  Skin:  Skin is warm and dry. No rash noted. Psychiatric: Mood and affect are normal. Speech and behavior are normal.  ____________________________________________   LABS (all labs ordered are listed, but only abnormal results are displayed)  Labs Reviewed  BASIC METABOLIC PANEL - Abnormal; Notable for the following components:      Result Value   Glucose, Bld 112 (*)    BUN 27 (*)    All other  components within normal limits  CBC - Abnormal; Notable for the following components:   WBC 11.6 (*)    All other components within normal limits  TROPONIN I (HIGH SENSITIVITY) - Abnormal; Notable for the following components:   Troponin I (High Sensitivity) 5,843 (*)    All other components within normal limits  RESP PANEL BY RT-PCR (FLU A&B, COVID) ARPGX2  PROTIME-INR  APTT  HEPARIN LEVEL (UNFRACTIONATED)  TROPONIN I (HIGH SENSITIVITY)   ____________________________________________  EKG  ED ECG REPORT I, Arta Silence, the attending physician, personally viewed and interpreted this ECG.  Date: 01/28/2021 EKG Time: 2311 Rate: 74 Rhythm: normal sinus rhythm QRS Axis: normal Intervals: normal ST/T Wave abnormalities: normal Narrative Interpretation: no evidence of acute ischemia  ____________________________________________  RADIOLOGY  Chest x-ray interpreted by me shows no focal infiltrate or edema  ____________________________________________   PROCEDURES  Procedure(s) performed: No  Procedures  Critical Care performed: Yes  CRITICAL CARE Performed by: Arta Silence   Total critical care time: 30 minutes  Critical care time was exclusive of separately billable procedures and treating other patients.  Critical care was necessary to treat or prevent imminent or life-threatening deterioration.  Critical care was time spent personally by me on the following activities: development of treatment plan with patient and/or surrogate as well as nursing, discussions with consultants, evaluation of patient's response to treatment, examination of patient, obtaining history from patient or surrogate, ordering and performing treatments and interventions, ordering and review of laboratory studies, ordering and review of radiographic studies, pulse oximetry and re-evaluation of patient's condition. ____________________________________________   INITIAL  IMPRESSION / ASSESSMENT AND PLAN / ED COURSE  Pertinent labs & imaging results that were available during my care of the patient were reviewed by me and considered in my medical decision making (see chart for details).  60 year old male with PMH as noted above presents with intermittent chest pain over the last several days, constant for the last 12 hours today.  I reviewed the past medical records in [facility].  The patient has no recent prior ED visits.  She was last admitted in 2017 for a lumbar discectomy.  He has no prior cardiac history.  On exam the patient is overall relatively well-appearing.  His vital signs are normal except for mild hypertension.  The physical exam is otherwise unremarkable.  EKG is nonischemic.  Initial troponin is 5800.  The patient was immediately brought back to room for evaluation when this resulted.  Presentation is concerning for NSTEMI.  I suspect that the patient may have initially started to have an MI when the pain began several days ago.  There is no evidence of PE, acute CHF, or other etiology.  We will give aspirin and start the patient on a heparin infusion.  I paged cardiology for consultation.  Plan will be for admission.  ----------------------------------------- 1:23 AM on 01/28/2021 -----------------------------------------  The patient reports improved pain after morphine.  He has been started on a heparin infusion.  I consulted Dr. Nehemiah Massed from cardiology who agrees with the current plan.  I then consulted Dr. Posey Pronto from the hospitalist service for admission.  ____________________________________________   FINAL CLINICAL IMPRESSION(S) / ED DIAGNOSES  Final diagnoses:  NSTEMI (non-ST elevated myocardial infarction) ([place])      NEW MEDICATIONS STARTED DURING THIS VISIT:  New Prescriptions   No medications on file     Note:  This document was prepared using Dragon voice recognition software and may include unintentional dictation  errors.    Arta Silence, MD 01/28/21 [phone]

## 2021-01-28 NOTE — Progress Notes (Signed)
Same day rounding progress note  Patient seen and examined while in the ED, waiting for floor bed.  Vitals, labs and medical records reviewed.  Agree with dictated history and physical and appreciate cardiologist input.  Non-STEMI Continue heparin drip, aspirin, Lipitor, metoprolol, nitro, Plavix and plan for cardiac catheterization on Monday. Patient is chest pain-free at this time  Time spent: 15 minutes

## 2021-01-28 NOTE — Progress Notes (Signed)
ANTICOAGULATION CONSULT NOTE - Initial Consult  Pharmacy Consult for Heparin  Indication: chest pain/ACS  Allergies  Allergen Reactions  . Bee Venom Swelling  . Amoxicillin Hives  . Sulfamethoxazole-Trimethoprim Nausea Only    Upset stomach    Patient Measurements: Height: 6\' 2"  (188 cm) Weight: 117 kg (257 lb 14.4 oz) IBW/kg (Calculated) : 82.2 Heparin Dosing Weight: 107.4 kg   Vital Signs: Temp: 98.1 F (36.7 C) (02/19 1953) Temp Source: Oral (02/19 1721) BP: 121/64 (02/19 1953) Pulse Rate: 81 (02/19 1953)  Labs: Recent Labs    01/27/21 2327 01/28/21 0042 01/28/21 0120 01/28/21 0347 01/28/21 0605 01/28/21 0750 01/28/21 0900 01/28/21 1924  HGB 15.3  --   --  14.4  --   --   --   --   HCT 44.7  --   --  42.5  --   --   --   --   PLT 194  --   --  214  --   --   --   --   APTT  --  31  --   --   --   --   --   --   LABPROT  --  13.2  --  13.8  --   --   --   --   INR  --  1.0  --  1.1  --   --   --   --   HEPARINUNFRC  --   --   --   --  >2.20*  --  0.73* 0.37  CREATININE 1.12  --   --  1.18  --   --   --   --   TROPONINIHS 5,843*  --    < > 9,856* 12,555* 13,572*  --   --    < > = values in this interval not displayed.    Estimated Creatinine Clearance: 89.4 mL/min (by C-G formula based on SCr of 1.18 mg/dL).   Medical History: Past Medical History:  Diagnosis Date  . Cancer ([place])    renal cell 2 years ago and bladder cancer 2004   . Chronic kidney disease    right kidney removed 2 years ago renal cell  . Heart murmur   . History of bladder cancer   . Hypothyroidism     Assessment/PTA Med: Pharmacy consulted to dose heparin in this 60 year old male admitted with ACS/NSTEMI.    CrCl = 94.7 ml/min No prior anticoag noted.   First level returned > 2.20, but with troponin's escalating [phone] decided not to hold drip  - discussed with RN Yong Channel about previous draw which seemed to have possibly been drawn out of the same line heparin was running  - RN re-drew heparin level with a hard stick without stopping drip and level returned @ 0.73, only slightly supratherapeutic.   02/19 0900 HL 0.73 heparin infusion reduced to 1400 units/hr 02/19 1924 HL 0.37 , therapeutic x 1   Goal of Therapy:  Heparin level 0.3-0.7 units/ml Monitor platelets by anticoagulation protocol: Yes   Plan:  Heparin therapeutic. Re-check anti-Xa level in 6 hours to confirm and daily while on heparin Continue to monitor H&H and platelets  Dorothe Pea, PharmD, BCPS Clinical Pharmacist 01/28/2021 8:08 PM

## 2021-01-28 NOTE — H&P (Signed)
History and Physical    Marc [name] EXB:[id number] DOB: 08/09/1961 DOA: 01/28/2021  PCP: Maryland Pink, MD    Patient coming from:  Home   Chief Complaint:  Chest pain   HPI: Marc [name] is a 60 y.o. male with c/o chest pain that started last Sunday, Wednesday he noticed chest pain. Located-midsternal 10/10 radiating to both arm/ Pt denies any sob or n/v diaphoresis,pain was dull then sharp and intermittent. No worsening or alleviating factor with rest. And ntg helped in ed. Pt took antacids and it did not help and so he came to ed.  He initially went  to clinic and ekg was done which was ok but still sent to ed for precaution.    Past medical history significant of H/O tobacco x 30 years and quit 2004/ bladder and kidney cancer, medication allergies.    ED Course:  Vitals:   01/27/21 2317 01/27/21 2321 01/28/21 0030 01/28/21 0040  BP: (!) 140/101  (!) 133/95   Pulse: 74  71   Resp: 18  13   Temp: 97.6 F (36.4 C)     TempSrc: Oral     SpO2: 94%  94% 95%  Weight:  118.4 kg    Height:  6\' 2"  (1.88 m)    In ed pt is a/o and in NAD/ vitals are stable and heparin drip is hanging.  Labs are stable except for elevated troponin 5000's.   Review of Systems:  Review of Systems  Constitutional: Negative.   HENT: Negative.   Eyes: Negative.   Respiratory: Negative.   Cardiovascular: Positive for chest pain. Negative for palpitations, orthopnea, claudication, leg swelling and PND.  Gastrointestinal: Negative.   Genitourinary: Negative.   Musculoskeletal: Negative.   Skin: Negative.   Neurological: Negative.      Past Medical History:  Diagnosis Date  . Cancer ([place])    renal cell 2 years ago and bladder cancer 2004   . Chronic kidney disease    right kidney removed 2 years ago renal cell  . Heart murmur   . History of bladder cancer   . Hypothyroidism     Past Surgical History:  Procedure Laterality Date  . APPENDECTOMY    . BLADDER SURGERY    . HERNIA  REPAIR    . kidney removed Right   . LUMBAR LAMINECTOMY/DECOMPRESSION MICRODISCECTOMY N/A 05/30/2016   Procedure: LUMBAR DECOMPRESSION DISCECTOMY L2-3;  Surgeon: Melina Schools, MD;  Location: [place];  Service: Orthopedics;  Laterality: N/A;  . TONSILLECTOMY       reports that he quit smoking about 18 years ago. His smokeless tobacco use includes snuff. He reports current drug use. Drug: Marijuana. He reports that he does not drink alcohol.  Allergies  Allergen Reactions  . Bee Venom Swelling  . Amoxicillin Hives  . Sulfamethoxazole-Trimethoprim Nausea Only    Upset stomach    Family History  Problem Relation Age of Onset  . Heart failure Father     Prior to Admission medications   Medication Sig Start Date End Date Taking? Authorizing Provider  brimonidine (ALPHAGAN) 0.2 % ophthalmic solution Apply to eye. 02/23/20  Yes [provider]  dorzolamide-timolol (COSOPT) 22.3-6.8 MG/ML ophthalmic solution INSTILL 1 DROP IN LEFT EYE TWICE DAILY 01/15/20  Yes [provider]  omeprazole (PRILOSEC) 40 MG capsule Take by mouth. 01/26/21 01/26/22 Yes [provider]  levothyroxine (SYNTHROID) 200 MCG tablet Take 200 mcg by mouth daily before breakfast.  01/16/19 01/16/20  [provider]  rosuvastatin (CRESTOR) 10 MG tablet Take 1 tablet by mouth daily. 06/22/20 06/22/21  [provider]  sucralfate (CARAFATE) 1 g tablet Take 1 tablet (1 g total) by mouth 4 (four) times daily -  with meals and at bedtime for 14 days. 01/27/21 02/10/21  Loura Halt A, NP  timolol (TIMOPTIC) 0.5 % ophthalmic solution 1 drop daily.  02/27/19   [provider]    Physical Exam: Vitals:   01/27/21 2317 01/27/21 2321 01/28/21 0030 01/28/21 0040  BP: (!) 140/101  (!) 133/95   Pulse: 74  71   Resp: 18  13   Temp: 97.6 F (36.4 C)     TempSrc: Oral     SpO2: 94%  94% 95%  Weight:  118.4 kg    Height:  6\' 2"  (1.88 m)      Physical Exam Vitals and nursing note reviewed.   Constitutional:      General: He is not in acute distress.    Appearance: He is not ill-appearing.  HENT:     Head: Normocephalic and atraumatic.     Nose: Nose normal.  Eyes:     Extraocular Movements: Extraocular movements intact.     Pupils: Pupils are equal, round, and reactive to light.  Neck:     Vascular: No carotid bruit.  Cardiovascular:     Rate and Rhythm: Normal rate and regular rhythm.     Pulses: Normal pulses.     Heart sounds: No murmur heard.   Pulmonary:     Effort: Pulmonary effort is normal.     Breath sounds: Normal breath sounds.  Chest:     Chest wall: No swelling or tenderness.    Abdominal:     General: Bowel sounds are normal. There is no distension.     Palpations: Abdomen is soft. There is no mass.     Tenderness: There is no abdominal tenderness. There is no guarding.     Hernia: No hernia is present.  Musculoskeletal:     Right lower leg: No edema.     Left lower leg: No edema.  Skin:    General: Skin is warm and dry.  Neurological:     General: No focal deficit present.     Mental Status: He is alert and oriented to person, place, and time.  Psychiatric:        Mood and Affect: Mood normal.        Behavior: Behavior normal.      Labs on Admission: I have personally reviewed following labs and imaging studies Labs  No results for input(s): CKTOTAL, CKMB, TROPONINI in the last 72 hours. Lab Results  Component Value Date   WBC 11.6 (H) 01/27/2021   HGB 15.3 01/27/2021   HCT 44.7 01/27/2021   MCV 88.7 01/27/2021   PLT 194 01/27/2021    Recent Labs  Lab 01/27/21 2327  NA 139  K 3.8  CL 107  CO2 22  BUN 27*  CREATININE 1.12  CALCIUM 9.1  GLUCOSE 112*   No results found for: CHOL, HDL, LDLCALC, TRIG No results found for: DDIMER Invalid input(s): POCBNP  Urinalysis    Component Value Date/Time   APPEARANCEUR Clear 05/20/2020 0932   GLUCOSEU Negative 05/20/2020 0932   BILIRUBINUR Negative 05/20/2020 0932   PROTEINUR  Negative 05/20/2020 0932   NITRITE Negative 05/20/2020 0932   LEUKOCYTESUR Negative 05/20/2020 0932   COVID-19 Labs  No results for input(s): DDIMER, FERRITIN, LDH, CRP in the last 72 hours.  Lab Results  Component Value Date   [place] NEGATIVE 01/28/2021    Radiological Exams on Admission: DG Chest 2 View  Result Date: 01/27/2021 CLINICAL DATA:  Chest pain, shortness of breath EXAM: CHEST - 2 VIEW COMPARISON:  Radiograph 11/01/2014 FINDINGS: Coarsened interstitial changes and bronchitic features appear quite similar to the comparison exam accounting for differences in technique. No new consolidative opacity or convincing features of edema. Cardiac size is top normal. The remaining cardiomediastinal contours are unremarkable. No acute osseous or soft tissue abnormality. Degenerative changes are present in the imaged spine and shoulders. IMPRESSION: Chronic bronchitic and interstitial changes. No acute cardiopulmonary abnormality. Electronically Signed   By: Lovena Le M.D.   On: 01/27/2021 23:48   DG Chest Portable 1 View  Result Date: 01/28/2021 CLINICAL DATA:  Chest pain. EXAM: PORTABLE CHEST 1 VIEW COMPARISON:  January 27, 2021 (11:41 p.m.) FINDINGS: Mild, chronic appearing increased lung markings are seen. Very mild atelectatic changes are noted within the bilateral lung bases, right slightly greater than left. There is no evidence of a pleural effusion or pneumothorax. The heart size and mediastinal contours are within normal limits. The visualized skeletal structures are unremarkable. IMPRESSION: Very mild bibasilar atelectatic changes. Electronically Signed   By: Virgina Norfolk M.D.   On: 01/28/2021 00:45    EKG: Independently reviewed.  Sinus rhythm 65 no st tw changes.     Assessment/Plan Principal Problem:   NSTEMI (non-ST elevated myocardial infarction) [facility]) Active Problems:   Hypothyroidism   Hypercholesteremia NSTEMI: -Admit to ICU. -Cardiology Consult /  Interventional Assessment -ASA 81mg  PO daily  -Clopidogrel 75mg  PO daily (initial load 300mg )  Pt has no h/o GIB or melena or ulcers/ s/p colonoscopy and was normal. -Atorvastatin 80 mg - Consider Beta-blocker: Metoprolol 12.5mg  PO bid (titrate to goal HR<70)  (Hold if SBP<90 / cardiogenic shock)  - NTG + morphine PRN chest pain or NTG drip if ongoing chest pain  - Anti-coagulation (Heparin drip per ACS protocol) - Assess LV Function (i.e., TTEcho) - ACE-I / ARB if EF<40% (though GISSI-3 indicates can help regardless of EF) -PT is currently chest pain free and has been since ntg and pain meds in er.  Hypothyroidism: -TFT/ cont levothyroxine 200 mcg.  Hypercholesterolemia:  -Pt just started on pravastatin dn we will change to atorvastatin 80 mg today. Am lipid panel.   DVT prophylaxis:  Heparin  Code Status:  Full code   Family Communication:  SHAMOND, SKELTON (Spouse)  [phone] (Home Phone)  Disposition Plan:  Home   Consults called:  Cardiology- Dr Nehemiah Massed called by EDMD.  Admission status: Inpatient.    Para Skeans MD [facility] [phone] How to contact the [facility] Attending or Consulting provider [place] or covering provider during after hours [place], for this patient?    1. Check the care team in [facility] and look for a) attending/consulting [facility] provider listed and b) the [facility] team listed 2. Log into [email or web address] and use [place]'s universal password to access. If you do not have the password, please contact the hospital operator. 3. Locate the [facility] provider you are looking for under [facility] and page to a number that you can be directly reached. 4. If you still have difficulty reaching the provider, please page the [facility] (Director on Call) for the Hospitalists listed on amion for assistance. [email or web address] Password [facility] 01/28/2021, 1:38 AM

## 2021-01-28 NOTE — Consult Note (Signed)
[facility] Cardiology Consultation Note  Patient ID: Marc [name], MRN: [id number], DOB/AGE: 1961-06-28 60 y.o. Admit date: 01/28/2021   Date of Consult: 01/28/2021 Primary Physician: Maryland Pink, MD Primary Cardiologist: None  Chief Complaint:  Chief Complaint  Patient presents with  . Chest Pain   Reason for Consult: Myocardial infarction  HPI: 60 y.o. male with known hyperlipidemia and hypertension who has been appropriately treated for the last many months but had new onset significant issues with some stuttering episodes of chest discomfort.  This chest discomfort started a week ago as an heartburn type symptoms and was burning in his chest and radiating into his arms.  It did radiate all down to his hands.  This was off and on for the last several days and to a significant extent day of admission.  The patient has had full relief of this chest pain with heparin and hospitalization without a medication.  Currently the patient is stable at this time and had no further chest discomfort.  The patient does have a chest x-ray showing atelectasis and EKG showing normal sinus rhythm otherwise normal EKG.  Troponin currently is peaking at 9856.  We have discussed at length further treatment options including echocardiogram cardiac catheterization and he understands the risk and benefits of these procedures.  Past Medical History:  Diagnosis Date  . Cancer ([place])    renal cell 2 years ago and bladder cancer 2004   . Chronic kidney disease    right kidney removed 2 years ago renal cell  . Heart murmur   . History of bladder cancer   . Hypothyroidism       Surgical History:  Past Surgical History:  Procedure Laterality Date  . APPENDECTOMY    . BLADDER SURGERY    . HERNIA REPAIR    . kidney removed Right   . LUMBAR LAMINECTOMY/DECOMPRESSION MICRODISCECTOMY N/A 05/30/2016   Procedure: LUMBAR DECOMPRESSION DISCECTOMY L2-3;  Surgeon: Melina Schools, MD;  Location: [place];  Service:  Orthopedics;  Laterality: N/A;  . TONSILLECTOMY       Home Meds: Prior to Admission medications   Medication Sig Start Date End Date Taking? Authorizing Provider  acetaminophen (TYLENOL) 500 MG tablet Take 1,000 mg by mouth every morning.   Yes [provider]  diphenhydramine-acetaminophen (TYLENOL PM) 25-500 MG TABS tablet Take 2 tablets by mouth at bedtime as needed.   Yes [provider]  levothyroxine (SYNTHROID) 200 MCG tablet Take 200 mcg by mouth daily before breakfast.  01/16/19 01/28/21 Yes [provider]  omeprazole (PRILOSEC) 40 MG capsule Take 40 mg by mouth daily. 01/26/21 01/26/22 Yes [provider]  rosuvastatin (CRESTOR) 10 MG tablet Take 1 tablet by mouth daily. 06/22/20 06/22/21 Yes [provider]  sucralfate (CARAFATE) 1 g tablet Take 1 tablet (1 g total) by mouth 4 (four) times daily -  with meals and at bedtime for 14 days. 01/27/21 02/10/21 Yes Bast, Traci A, NP  timolol (TIMOPTIC) 0.5 % ophthalmic solution Place 1 drop into the left eye daily. 02/27/19  Yes [provider]    Inpatient Medications:  . [START ON 01/29/2021] aspirin EC  81 mg Oral Daily  . atorvastatin  80 mg Oral Daily  . clopidogrel  75 mg Oral Q breakfast  . lidocaine  1 application Urethral Once  . metoprolol tartrate  12.5 mg Oral BID   . sodium chloride 75 mL/hr at 01/28/21 0211  . heparin 1,500 Units/hr (01/28/21 0051)    Allergies:  Allergies  Allergen Reactions  . Bee Venom Swelling  . Amoxicillin Hives  . Sulfamethoxazole-Trimethoprim Nausea Only    Upset stomach    Social History   Socioeconomic History  . Marital status: Married    Spouse name: Not on file  . Number of children: Not on file  . Years of education: Not on file  . Highest education level: Not on file  Occupational History  . Not on file  Tobacco Use  . Smoking status: Former Smoker    Quit date: 12/10/2002    Years since quitting: 18.1  . Smokeless tobacco:  Current User    Types: Snuff  Vaping Use  . Vaping Use: Never used  Substance and Sexual Activity  . Alcohol use: No  . Drug use: Yes    Types: Marijuana    Comment: not even a joint a day  . Sexual activity: Yes    Birth control/protection: None  Other Topics Concern  . Not on file  Social History Narrative  . Not on file   Social Determinants of Health   Financial Resource Strain: Not on file  Food Insecurity: Not on file  Transportation Needs: Not on file  Physical Activity: Not on file  Stress: Not on file  Social Connections: Not on file  Intimate Partner Violence: Not on file     Family History  Problem Relation Age of Onset  . Heart failure Father      Review of Systems Positive for chest pain Negative for: General:  chills, fever, night sweats or weight changes.  Cardiovascular: PND orthopnea syncope dizziness  Dermatological skin lesions rashes Respiratory: Cough congestion Urologic: Frequent urination urination at night and hematuria Abdominal: negative for nausea, vomiting, diarrhea, bright red blood per rectum, melena, or hematemesis Neurologic: negative for visual changes, and/or hearing changes  All other systems reviewed and are otherwise negative except as noted above.  Labs: No results for input(s): CKTOTAL, CKMB, TROPONINI in the last 72 hours. Lab Results  Component Value Date   WBC 12.9 (H) 01/28/2021   HGB 14.4 01/28/2021   HCT 42.5 01/28/2021   MCV 90.4 01/28/2021   PLT 214 01/28/2021    Recent Labs  Lab 01/28/21 0347  NA 138  K 4.0  CL 105  CO2 25  BUN 25*  CREATININE 1.18  CALCIUM 8.8*  GLUCOSE 107*   Lab Results  Component Value Date   CHOL 164 01/28/2021   HDL 45 01/28/2021   LDLCALC 109 (H) 01/28/2021   TRIG 49 01/28/2021   No results found for: DDIMER  Radiology/Studies:  DG Chest 2 View  Result Date: 01/27/2021 CLINICAL DATA:  Chest pain, shortness of breath EXAM: CHEST - 2 VIEW COMPARISON:  Radiograph  11/01/2014 FINDINGS: Coarsened interstitial changes and bronchitic features appear quite similar to the comparison exam accounting for differences in technique. No new consolidative opacity or convincing features of edema. Cardiac size is top normal. The remaining cardiomediastinal contours are unremarkable. No acute osseous or soft tissue abnormality. Degenerative changes are present in the imaged spine and shoulders. IMPRESSION: Chronic bronchitic and interstitial changes. No acute cardiopulmonary abnormality. Electronically Signed   By: Lovena Le M.D.   On: 01/27/2021 23:48   DG Chest Portable 1 View  Result Date: 01/28/2021 CLINICAL DATA:  Chest pain. EXAM: PORTABLE CHEST 1 VIEW COMPARISON:  January 27, 2021 (11:41 p.m.) FINDINGS: Mild, chronic appearing increased lung markings are seen. Very mild atelectatic changes are noted within the bilateral lung bases, right slightly  greater than left. There is no evidence of a pleural effusion or pneumothorax. The heart size and mediastinal contours are within normal limits. The visualized skeletal structures are unremarkable. IMPRESSION: Very mild bibasilar atelectatic changes. Electronically Signed   By: Virgina Norfolk M.D.   On: 01/28/2021 00:45    EKG: Normal sinus rhythm otherwise normal EKG  Weights: Filed Weights   01/27/21 2321  Weight: 118.4 kg     Physical Exam: Blood pressure (!) 139/120, pulse 69, temperature 97.6 F (36.4 C), temperature source Oral, resp. rate 16, height 6\' 2"  (1.88 m), weight 118.4 kg, SpO2 97 %. Body mass index is 33.51 kg/m. General: Well developed, well nourished, in no acute distress. Head eyes ears nose throat: Normocephalic, atraumatic, sclera non-icteric, no xanthomas, nares are without discharge. No apparent thyromegaly and/or mass  Lungs: Normal respiratory effort.  no wheezes, no rales, no rhonchi.  Heart: RRR with normal S1 S2. no murmur gallop, no rub, PMI is normal size and placement, carotid  upstroke normal without bruit, jugular venous pressure is normal Abdomen: Soft, non-tender, non-distended with normoactive bowel sounds. No hepatomegaly. No rebound/guarding. No obvious abdominal masses. Abdominal aorta is normal size without bruit Extremities: No edema. no cyanosis, no clubbing, no ulcers  Peripheral : 2+ bilateral upper extremity pulses, 2+ bilateral femoral pulses, 2+ bilateral dorsal pedal pulse Neuro: Alert and oriented. No facial asymmetry. No focal deficit. Moves all extremities spontaneously. Musculoskeletal: Normal muscle tone without kyphosis Psych:  Responds to questions appropriately with a normal affect.    Assessment: 60 year old male with hypertension hyperlipidemia and acute non-ST elevation myocardial infarction without evidence of congestive heart failure  Plan: 1.  Heparin intravenously along with aspirin and Plavix for further risk reduction of myocardial infarction 2.  Beta-blocker and/or ACE inhibitor for myocardial infarction and further risk reduction of congestive heart failure 3.  Nitrates as necessary for chest discomfort 4.  Echocardiogram for LV systolic dysfunction valvular heart disease contributing to above 4.  Proceed to cardiac catheterization to assess coronary anatomy and further treatment thereof as necessary.  Patient understands the risk and benefits of cardiac catheterization.  This includes the possibility of death stroke heart attack infection bleeding blood clot.  This patient is at low risk for conscious sedation.  Plan for cardiac catheterization on Monday  Signed, Corey Skains M.D. [facility] 01/28/2021, 7:07 AM

## 2021-01-28 NOTE — ED Notes (Signed)
Meal tray provided per pt request.  

## 2021-01-28 NOTE — Progress Notes (Signed)
ANTICOAGULATION CONSULT NOTE - Initial Consult  Pharmacy Consult for Heparin  Indication: chest pain/ACS  Allergies  Allergen Reactions  . Bee Venom Swelling  . Amoxicillin Hives  . Sulfamethoxazole-Trimethoprim Nausea Only    Upset stomach    Patient Measurements: Height: 6\' 2"  (188 cm) Weight: 118.4 kg (261 lb) IBW/kg (Calculated) : 82.2 Heparin Dosing Weight: 107.4 kg   Vital Signs: Temp: 97.6 F (36.4 C) (02/18 2317) Temp Source: Oral (02/18 2317) BP: 127/86 (02/19 1000) Pulse Rate: 67 (02/19 1000)  Labs: Recent Labs    01/27/21 2327 01/28/21 0042 01/28/21 0120 01/28/21 0347 01/28/21 0605 01/28/21 0750 01/28/21 0900  HGB 15.3  --   --  14.4  --   --   --   HCT 44.7  --   --  42.5  --   --   --   PLT 194  --   --  214  --   --   --   APTT  --  31  --   --   --   --   --   LABPROT  --  13.2  --  13.8  --   --   --   INR  --  1.0  --  1.1  --   --   --   HEPARINUNFRC  --   --   --   --  >2.20*  --  0.73*  CREATININE 1.12  --   --  1.18  --   --   --   TROPONINIHS 5,843*  --    < > 9,856* 12,555* 13,572*  --    < > = values in this interval not displayed.    Estimated Creatinine Clearance: 89.9 mL/min (by C-G formula based on SCr of 1.18 mg/dL).   Medical History: Past Medical History:  Diagnosis Date  . Cancer ([place])    renal cell 2 years ago and bladder cancer 2004   . Chronic kidney disease    right kidney removed 2 years ago renal cell  . Heart murmur   . History of bladder cancer   . Hypothyroidism     Assessment/PTA Med: Pharmacy consulted to dose heparin in this 60 year old male admitted with ACS/NSTEMI.    CrCl = 94.7 ml/min No prior anticoag noted.   First level returned > 2.20, but with troponin's escalating [phone] decided not to hold drip  - discussed with RN Yong Channel about previous draw which seemed to have possibly been drawn out of the same line heparin was running - RN re-drew heparin level with a hard stick without stopping  drip and level returned @ 0.73, only slightly supratherapeutic.   02/19 0900 HL 0.73 @ 1500 units/hr  Goal of Therapy:  Heparin level 0.3-0.7 units/ml Monitor platelets by anticoagulation protocol: Yes   Plan:  Reduce heparin infusion to 1400 units/hr  Re-check anti-Xa level in 6 hours and daily while on heparin Continue to monitor H&H and platelets  Lu Duffel, PharmD, BCPS Clinical Pharmacist 01/28/2021 10:56 AM

## 2021-01-28 NOTE — ED Notes (Signed)
Pt with 4 beat run of vtach returning to sinus rhythm following. Pt resting at the time. Pt alert when this RN to bedside. [place] NP notified. No new orders at this time.

## 2021-01-28 NOTE — ED Notes (Signed)
ED TO INPATIENT HANDOFF REPORT  ED Nurse Name and Phone #:  Yong Channel RN [phone]  S Name/Age/Gender Marc [name] 60 y.o. male Room/Bed: ED02A/ED02A  Code Status   Code Status: Full Code  Home/SNF/Other Home Patient oriented to: self, place, time and situation Is this baseline? Yes   Triage Complete: Triage complete  Chief Complaint Non-ST elevation MI (NSTEMI) [facility]) [I21.4]  Triage Note Pt presents to ER c/o chest pain that started Sunday and has been happening intermittently.  Pt seen by PCP and treated for GERD without relief.  Pt states pain in in middle of his chest and radiates to right arm.  Pt states pain is burning in nature and endorses associated SOB.  Pt A&O x4 at this time. NAD noted at this time.     Allergies Allergies  Allergen Reactions  . Bee Venom Swelling  . Amoxicillin Hives  . Sulfamethoxazole-Trimethoprim Nausea Only    Upset stomach    Level of Care/Admitting Diagnosis ED Disposition    ED Disposition Condition [facility]: [place] [100120]  Level of Care: Progressive Cardiac [106]  Admit to Progressive based on following criteria: CARDIOVASCULAR & THORACIC of moderate stability with acute coronary syndrome symptoms/low risk myocardial infarction/hypertensive urgency/arrhythmias/heart failure potentially compromising stability and stable post cardiovascular intervention patients.  Covid Evaluation: Confirmed COVID Positive  Diagnosis: Non-ST elevation MI (NSTEMI) [facility]) [[id number]]  Admitting Physician: Cherylann Ratel  Attending Physician: Cherylann Ratel  Estimated length of stay: 3 - 4 days  Certification:: I certify this patient will need inpatient services for at least 2 midnights       B Medical/Surgery History Past Medical History:  Diagnosis Date  . Cancer ([place])    renal cell 2 years ago and bladder cancer 2004   . Chronic kidney disease    right kidney removed 2 years ago renal  cell  . Heart murmur   . History of bladder cancer   . Hypothyroidism    Past Surgical History:  Procedure Laterality Date  . APPENDECTOMY    . BLADDER SURGERY    . HERNIA REPAIR    . kidney removed Right   . LUMBAR LAMINECTOMY/DECOMPRESSION MICRODISCECTOMY N/A 05/30/2016   Procedure: LUMBAR DECOMPRESSION DISCECTOMY L2-3;  Surgeon: Melina Schools, MD;  Location: [facility];  Service: Orthopedics;  Laterality: N/A;  . TONSILLECTOMY       A IV Location/Drains/Wounds Patient Lines/Drains/Airways Status    Active Line/Drains/Airways    Name Placement date Placement time Site Days   Peripheral IV 01/28/21 Left Antecubital 01/28/21  0040  Antecubital  less than 1   Peripheral IV 01/28/21 Left;Posterior Forearm 01/28/21  0040  Forearm  less than 1   Incision (Closed) 05/30/16 Back Other (Comment) 05/30/16  0934  - 1704          Intake/Output Last 24 hours No intake or output data in the 24 hours ending 01/28/21 1635  Labs/Imaging Results for orders placed or performed during the hospital encounter of 01/28/21 (from the past 48 hour(s))  Basic metabolic panel     Status: Abnormal   Collection Time: 01/27/21 11:27 PM  Result Value Ref Range   Sodium 139 135 - 145 mmol/L   Potassium 3.8 3.5 - 5.1 mmol/L   Chloride 107 98 - 111 mmol/L   CO2 22 22 - 32 mmol/L   Glucose, Bld 112 (H) 70 - 99 mg/dL    Comment: Glucose reference range applies only  to samples taken after fasting for at least 8 hours.   BUN 27 (H) 8 - 23 mg/dL   Creatinine, Ser 1.12 0.61 - 1.24 mg/dL   Calcium 9.1 8.9 - 10.3 mg/dL   GFR, Estimated >60 >60 mL/min    Comment: (NOTE) Calculated using the CKD-EPI Creatinine Equation (2021)    Anion gap 10 5 - 15    Comment: Performed at [facility], [address] [place], [place] [address]  CBC     Status: Abnormal   Collection Time: 01/27/21 11:27 PM  Result Value Ref Range   WBC 11.6 (H) 4.0 - 10.5 K/uL   RBC 5.04 4.22 - 5.81 MIL/uL   Hemoglobin 15.3 13.0 -  17.0 g/dL   HCT 44.7 39.0 - 52.0 %   MCV 88.7 80.0 - 100.0 fL   MCH 30.4 26.0 - 34.0 pg   MCHC 34.2 30.0 - 36.0 g/dL   RDW 12.3 11.5 - 15.5 %   Platelets 194 150 - 400 K/uL   nRBC 0.0 0.0 - 0.2 %    Comment: Performed at [facility], [address] [place], [place] [address]  Troponin I (High Sensitivity)     Status: Abnormal   Collection Time: 01/27/21 11:27 PM  Result Value Ref Range   Troponin I (High Sensitivity) 5,843 (HH) <18 ng/L    Comment: CRITICAL RESULT CALLED TO, READ BACK BY AND VERIFIED WITH  ASHTON PETERS 01/28/21 @ 0013 ADL (NOTE) Elevated high sensitivity troponin I (hsTnI) values and significant  changes across serial measurements may suggest ACS but many other  chronic and acute conditions are known to elevate hsTnI results.  Refer to the "Links" section for chest pain algorithms and additional  guidance. Performed at [facility], [address] [place], [place] [address]   Resp Panel by RT-PCR (Flu A&B, Covid) Nasopharyngeal Swab     Status: None   Collection Time: 01/28/21 12:42 AM   Specimen: Nasopharyngeal Swab; Nasopharyngeal(NP) swabs in vial transport medium  Result Value Ref Range   SARS Coronavirus 2 by RT PCR NEGATIVE NEGATIVE    Comment: (NOTE) SARS-CoV-2 target nucleic acids are NOT DETECTED.  The SARS-CoV-2 RNA is generally detectable in upper respiratory specimens during the acute phase of infection. The lowest concentration of SARS-CoV-2 viral copies this assay can detect is 138 copies/mL. A negative result does not preclude SARS-Cov-2 infection and should not be used as the sole basis for treatment or other patient management decisions. A negative result may occur with  improper specimen collection/handling, submission of specimen other than nasopharyngeal swab, presence of viral mutation(s) within the areas targeted by this assay, and inadequate number of viral copies(<138 copies/mL). A negative result must be  combined with clinical observations, patient history, and epidemiological information. The expected result is Negative.  Fact Sheet for Patients:  EntrepreneurPulse.com.au  Fact Sheet for Healthcare Providers:  IncredibleEmployment.be  This test is no t yet approved or cleared by the [place] FDA and  has been authorized for detection and/or diagnosis of SARS-CoV-2 by FDA under an Emergency Use Authorization (EUA). This EUA will remain  in effect (meaning this test can be used) for the duration of the COVID-19 declaration under Section 564(b)(1) of the Act, 21 U.S.C.section 360bbb-3(b)(1), unless the authorization is terminated  or revoked sooner.       Influenza A by PCR NEGATIVE NEGATIVE   Influenza B by PCR NEGATIVE NEGATIVE    Comment: (NOTE) The Xpert Xpress SARS-CoV-2/FLU/RSV plus assay  is intended as an aid in the diagnosis of influenza from Nasopharyngeal swab specimens and should not be used as a sole basis for treatment. Nasal washings and aspirates are unacceptable for Xpert Xpress SARS-CoV-2/FLU/RSV testing.  Fact Sheet for Patients: EntrepreneurPulse.com.au  Fact Sheet for Healthcare Providers: IncredibleEmployment.be  This test is not yet approved or cleared by the [place] FDA and has been authorized for detection and/or diagnosis of SARS-CoV-2 by FDA under an Emergency Use Authorization (EUA). This EUA will remain in effect (meaning this test can be used) for the duration of the COVID-19 declaration under Section 564(b)(1) of the Act, 21 U.S.C. section 360bbb-3(b)(1), unless the authorization is terminated or revoked.  Performed at [facility], [address] [place], [place] [address]   Protime-INR     Status: None   Collection Time: 01/28/21 12:42 AM  Result Value Ref Range   Prothrombin Time 13.2 11.4 - 15.2 seconds   INR 1.0 0.8 - 1.2    Comment:  (NOTE) INR goal varies based on device and disease states. Performed at [facility], [address] [place], [place] [address]   APTT     Status: None   Collection Time: 01/28/21 12:42 AM  Result Value Ref Range   aPTT 31 24 - 36 seconds    Comment: Performed at [facility], [place], [place] [address]  Troponin I (High Sensitivity)     Status: Abnormal   Collection Time: 01/28/21  1:20 AM  Result Value Ref Range   Troponin I (High Sensitivity) 8,178 (HH) <18 ng/L    Comment: CRITICAL VALUE NOTED. VALUE IS CONSISTENT WITH PREVIOUSLY REPORTED/CALLED VALUE MF (NOTE) Elevated high sensitivity troponin I (hsTnI) values and significant  changes across serial measurements may suggest ACS but many other  chronic and acute conditions are known to elevate hsTnI results.  Refer to the "Links" section for chest pain algorithms and additional  guidance. Performed at [facility], [address] [place], [place] [address]   T4, free     Status: None   Collection Time: 01/28/21  3:47 AM  Result Value Ref Range   Free T4 1.12 0.61 - 1.12 ng/dL    Comment: (NOTE) Biotin ingestion may interfere with free T4 tests. If the results are inconsistent with the TSH level, previous test results, or the clinical presentation, then consider biotin interference. If needed, order repeat testing after stopping biotin. Performed at [facility], [address] [place], [place] [address]   TSH     Status: None   Collection Time: 01/28/21  3:47 AM  Result Value Ref Range   TSH 1.903 0.350 - 4.500 uIU/mL    Comment: Performed by a 3rd Generation assay with a functional sensitivity of <=0.01 uIU/mL. Performed at [facility], [address] [place], [place] [address]   Lipid panel     Status: Abnormal   Collection Time: 01/28/21  3:47 AM  Result Value Ref Range   Cholesterol 164 0 - 200 mg/dL   Triglycerides 49 <150 mg/dL   HDL 45  >40 mg/dL   Total CHOL/HDL Ratio 3.6 RATIO   VLDL 10 0 - 40 mg/dL   LDL Cholesterol 109 (H) 0 - 99 mg/dL    Comment:        Total Cholesterol/HDL:CHD Risk Coronary Heart Disease Risk Table                     Men  Women  1/2 Average Risk   3.4   3.3  Average Risk       5.0   4.4  2 X Average Risk   9.6   7.1  3 X Average Risk  23.4   11.0        Use the calculated Patient Ratio above and the CHD Risk Table to determine the patient's CHD Risk.        ATP III CLASSIFICATION (LDL):  <100     mg/dL   Optimal  100-129  mg/dL   Near or Above                    Optimal  130-159  mg/dL   Borderline  160-189  mg/dL   High  >190     mg/dL   Very High Performed at [facility], [place], [place] [address]   C-reactive protein     Status: None   Collection Time: 01/28/21  3:47 AM  Result Value Ref Range   CRP 0.5 <1.0 mg/dL    Comment: Performed at [facility] [address], [place], [place] [address]  Hemoglobin A1c     Status: None   Collection Time: 01/28/21  3:47 AM  Result Value Ref Range   Hgb A1c MFr Bld 5.4 4.8 - 5.6 %    Comment: (NOTE) Pre diabetes:          5.7%-6.4%  Diabetes:              >6.4%  Glycemic control for   <7.0% adults with diabetes    Mean Plasma Glucose 108.28 mg/dL    Comment: Performed at [facility] [address], [place], [place] [address]  HIV Antibody (routine testing w rflx)     Status: None   Collection Time: 01/28/21  3:47 AM  Result Value Ref Range   HIV Screen 4th Generation wRfx Non Reactive Non Reactive    Comment: Performed at [facility], [place] [address], [place], [place] [address]  Brain natriuretic peptide     Status: None   Collection Time: 01/28/21  3:47 AM  Result Value Ref Range   B Natriuretic Peptide 66.8 0.0 - 100.0 pg/mL    Comment: Performed at [facility], [address] [place], [place] [address]  Basic metabolic panel     Status: Abnormal   Collection Time:  01/28/21  3:47 AM  Result Value Ref Range   Sodium 138 135 - 145 mmol/L   Potassium 4.0 3.5 - 5.1 mmol/L    Comment: HEMOLYSIS AT THIS LEVEL MAY AFFECT RESULT   Chloride 105 98 - 111 mmol/L   CO2 25 22 - 32 mmol/L   Glucose, Bld 107 (H) 70 - 99 mg/dL    Comment: Glucose reference range applies only to samples taken after fasting for at least 8 hours.   BUN 25 (H) 8 - 23 mg/dL   Creatinine, Ser 1.18 0.61 - 1.24 mg/dL   Calcium 8.8 (L) 8.9 - 10.3 mg/dL   GFR, Estimated >60 >60 mL/min    Comment: (NOTE) Calculated using the CKD-EPI Creatinine Equation (2021)    Anion gap 8 5 - 15    Comment: Performed at [facility], [name] [place], [place] [address]  CBC     Status: Abnormal   Collection Time: 01/28/21  3:47 AM  Result Value Ref Range   WBC 12.9 (H) 4.0 -  10.5 K/uL   RBC 4.70 4.22 - 5.81 MIL/uL   Hemoglobin 14.4 13.0 - 17.0 g/dL   HCT 42.5 39.0 - 52.0 %   MCV 90.4 80.0 - 100.0 fL   MCH 30.6 26.0 - 34.0 pg   MCHC 33.9 30.0 - 36.0 g/dL   RDW 12.2 11.5 - 15.5 %   Platelets 214 150 - 400 K/uL   nRBC 0.0 0.0 - 0.2 %    Comment: Performed at [facility], [address] [place], [place] [address]  Protime-INR     Status: None   Collection Time: 01/28/21  3:47 AM  Result Value Ref Range   Prothrombin Time 13.8 11.4 - 15.2 seconds   INR 1.1 0.8 - 1.2    Comment: (NOTE) INR goal varies based on device and disease states. Performed at [facility], [place], [place] [address]   Troponin I (High Sensitivity)     Status: Abnormal   Collection Time: 01/28/21  3:47 AM  Result Value Ref Range   Troponin I (High Sensitivity) 9,856 (HH) <18 ng/L    Comment: CRITICAL VALUE NOTED. VALUE IS CONSISTENT WITH PREVIOUSLY REPORTED/CALLED VALUE MF (NOTE) Elevated high sensitivity troponin I (hsTnI) values and significant  changes across serial measurements may suggest ACS but many other  chronic and acute conditions are known to elevate  hsTnI results.  Refer to the "Links" section for chest pain algorithms and additional  guidance. Performed at [facility], [place], [place] [address]   Heparin level (unfractionated)     Status: Abnormal   Collection Time: 01/28/21  6:05 AM  Result Value Ref Range   Heparin Unfractionated >2.20 (H) 0.30 - 0.70 IU/mL    Comment: RESULTS CONFIRMED BY MANUAL DILUTION (NOTE) If heparin results are below expected values, and patient dosage has  been confirmed, suggest follow up testing of antithrombin III levels. Performed at [facility], [place], [place] [address]   Troponin I (High Sensitivity)     Status: Abnormal   Collection Time: 01/28/21  6:05 AM  Result Value Ref Range   Troponin I (High Sensitivity) 12,555 (HH) <18 ng/L    Comment: CRITICAL VALUE NOTED. VALUE IS CONSISTENT WITH PREVIOUSLY REPORTED/CALLED VALUE [facility] (NOTE) Elevated high sensitivity troponin I (hsTnI) values and significant  changes across serial measurements may suggest ACS but many other  chronic and acute conditions are known to elevate hsTnI results.  Refer to the "Links" section for chest pain algorithms and additional  guidance. Performed at [facility], [place],  [address]   Troponin I (High Sensitivity)     Status: Abnormal   Collection Time: 01/28/21  7:50 AM  Result Value Ref Range   Troponin I (High Sensitivity) 13,572 (HH) <18 ng/L    Comment: CRITICAL VALUE NOTED. VALUE IS CONSISTENT WITH PREVIOUSLY REPORTED/CALLED VALUE ACR (NOTE) Elevated high sensitivity troponin I (hsTnI) values and significant  changes across serial measurements may suggest ACS but many other  chronic and acute conditions are known to elevate hsTnI results.  Refer to the "Links" section for chest pain algorithms and additional  guidance. Performed at [facility], [place], [place] [address]   Heparin level  (unfractionated)     Status: Abnormal   Collection Time: 01/28/21  9:00 AM  Result Value Ref Range   Heparin Unfractionated 0.73 (H) 0.30 - 0.70 IU/mL    Comment: (NOTE) If heparin results are  below expected values, and patient dosage has  been confirmed, suggest follow up testing of antithrombin III levels. Performed at [facility], [address] [place], [place] [address]    DG Chest 2 View  Result Date: 01/27/2021 CLINICAL DATA:  Chest pain, shortness of breath EXAM: CHEST - 2 VIEW COMPARISON:  Radiograph 11/01/2014 FINDINGS: Coarsened interstitial changes and bronchitic features appear quite similar to the comparison exam accounting for differences in technique. No new consolidative opacity or convincing features of edema. Cardiac size is top normal. The remaining cardiomediastinal contours are unremarkable. No acute osseous or soft tissue abnormality. Degenerative changes are present in the imaged spine and shoulders. IMPRESSION: Chronic bronchitic and interstitial changes. No acute cardiopulmonary abnormality. Electronically Signed   By: Lovena Le M.D.   On: 01/27/2021 23:48   DG Chest Portable 1 View  Result Date: 01/28/2021 CLINICAL DATA:  Chest pain. EXAM: PORTABLE CHEST 1 VIEW COMPARISON:  January 27, 2021 (11:41 p.m.) FINDINGS: Mild, chronic appearing increased lung markings are seen. Very mild atelectatic changes are noted within the bilateral lung bases, right slightly greater than left. There is no evidence of a pleural effusion or pneumothorax. The heart size and mediastinal contours are within normal limits. The visualized skeletal structures are unremarkable. IMPRESSION: Very mild bibasilar atelectatic changes. Electronically Signed   By: Virgina Norfolk M.D.   On: 01/28/2021 00:45    Pending Labs Unresulted Labs (From admission, onward)          Start     Ordered   01/29/21 0500  CBC  Tomorrow morning,   STAT        01/28/21 1106   01/28/21 1700   Heparin level (unfractionated)  Once-Timed,   STAT        01/28/21 1106   01/28/21 0500  C-reactive protein  Daily,   STAT      01/28/21 0152          Vitals/Pain Today's Vitals   01/28/21 1430 01/28/21 1500 01/28/21 1530 01/28/21 1600  BP: 117/69 127/79 132/87 117/81  Pulse: 75 75 72 75  Resp: 19 13 17 16   Temp:      TempSrc:      SpO2: 95% 94% 95% 96%  Weight:      Height:      PainSc:        Isolation Precautions No active isolations  Medications Medications  heparin ADULT infusion 100 units/mL (25000 units/27mL) (1,400 Units/hr Intravenous Rate/Dose Change 01/28/21 1114)  aspirin EC tablet 81 mg (has no administration in time range)  nitroGLYCERIN (NITROSTAT) SL tablet 0.4 mg (has no administration in time range)  acetaminophen (TYLENOL) tablet 650 mg (has no administration in time range)  ondansetron (ZOFRAN) injection 4 mg (has no administration in time range)  0.9 %  sodium chloride infusion ( Intravenous New Bag/Given 01/28/21 0211)  clopidogrel (PLAVIX) tablet 75 mg (75 mg Oral Given 01/28/21 0911)  metoprolol tartrate (LOPRESSOR) tablet 12.5 mg (12.5 mg Oral Given 01/28/21 0912)  atorvastatin (LIPITOR) tablet 80 mg (80 mg Oral Given 01/28/21 0912)  sodium chloride flush (NS) 0.9 % injection 3 mL (3 mLs Intravenous Not Given 01/28/21 0944)  aspirin chewable tablet 324 mg (324 mg Oral Given 01/28/21 0039)  morphine 4 MG/ML injection 4 mg (4 mg Intravenous Given 01/28/21 0053)  ondansetron (ZOFRAN) injection 4 mg (4 mg Intravenous Given 01/28/21 0052)  heparin bolus via infusion 4,000 Units (4,000 Units Intravenous Bolus from Bag 01/28/21 0051)  clopidogrel (PLAVIX) tablet 300  mg (300 mg Oral Given 01/28/21 0210)    Mobility walks Low fall risk   Focused Assessments Cardiac Assessment Handoff:  Cardiac Rhythm: Normal sinus rhythm No results found for: CKTOTAL, CKMB, CKMBINDEX, TROPONINI No results found for: DDIMER Does the Patient currently have chest pain? No       R Recommendations: See Admitting Provider Note  Report given to:   Additional Notes: n/a

## 2021-01-28 NOTE — Progress Notes (Signed)
ANTICOAGULATION CONSULT NOTE - Initial Consult  Pharmacy Consult for Heparin  Indication: chest pain/ACS  Allergies  Allergen Reactions  . Bee Venom Swelling  . Amoxicillin Hives  . Sulfamethoxazole-Trimethoprim Nausea Only    Upset stomach    Patient Measurements: Height: 6\' 2"  (188 cm) Weight: 118.4 kg (261 lb) IBW/kg (Calculated) : 82.2 Heparin Dosing Weight: 107.4 kg   Vital Signs: Temp: 97.6 F (36.4 C) (02/18 2317) Temp Source: Oral (02/18 2317) BP: 133/95 (02/19 0030) Pulse Rate: 71 (02/19 0030)  Labs: Recent Labs    01/27/21 2327  HGB 15.3  HCT 44.7  PLT 194  CREATININE 1.12  TROPONINIHS 5,843*    Estimated Creatinine Clearance: 94.7 mL/min (by C-G formula based on SCr of 1.12 mg/dL).   Medical History: Past Medical History:  Diagnosis Date  . Cancer ([place])    renal cell 2 years ago and bladder cancer 2004   . Chronic kidney disease    right kidney removed 2 years ago renal cell  . Heart murmur   . History of bladder cancer   . Hypothyroidism     Medications:  (Not in a hospital admission)   Assessment: Pharmacy consulted to dose heparin in this 60 year old male admitted with ACS/NSTEMI.  CrCl = 94.7 ml/min No prior anticoag noted.   Goal of Therapy:  Heparin level 0.3-0.7 units/ml Monitor platelets by anticoagulation protocol: Yes   Plan:  Give 4000 units bolus x 1 Start heparin infusion at 1500 units/hr Check anti-Xa level in 6 hours and daily while on heparin Continue to monitor H&H and platelets  Cauy Melody D 01/28/2021,12:43 AM

## 2021-01-29 LAB — CBC
HCT: 42 % (ref 39.0–52.0)
Hemoglobin: 14.1 g/dL (ref 13.0–17.0)
MCH: 29.7 pg (ref 26.0–34.0)
MCHC: 33.6 g/dL (ref 30.0–36.0)
MCV: 88.4 fL (ref 80.0–100.0)
Platelets: 171 10*3/uL (ref 150–400)
RBC: 4.75 MIL/uL (ref 4.22–5.81)
RDW: 12.3 % (ref 11.5–15.5)
WBC: 12.2 10*3/uL — ABNORMAL HIGH (ref 4.0–10.5)
nRBC: 0 % (ref 0.0–0.2)

## 2021-01-29 LAB — HEPARIN LEVEL (UNFRACTIONATED): Heparin Unfractionated: 0.3 IU/mL (ref 0.30–0.70)

## 2021-01-29 LAB — C-REACTIVE PROTEIN: CRP: 5.8 mg/dL — ABNORMAL HIGH (ref <1.0)

## 2021-01-29 MED ORDER — SENNOSIDES-DOCUSATE SODIUM 8.6-50 MG PO TABS
2.0000 | ORAL_TABLET | Freq: Two times a day (BID) | ORAL | Status: DC
  Administered 2021-01-29 – 2021-01-31 (×5): 2 via ORAL
  Filled 2021-01-29 (×5): qty 2

## 2021-01-29 MED ORDER — LEVOTHYROXINE SODIUM 100 MCG PO TABS
200.0000 ug | ORAL_TABLET | Freq: Every day | ORAL | Status: DC
  Administered 2021-01-30 – 2021-01-31 (×2): 200 ug via ORAL
  Filled 2021-01-29 (×2): qty 2

## 2021-01-29 MED ORDER — ASPIRIN 81 MG PO CHEW
81.0000 mg | CHEWABLE_TABLET | ORAL | Status: AC
  Administered 2021-01-30: 81 mg via ORAL
  Filled 2021-01-29: qty 1

## 2021-01-29 MED ORDER — PANTOPRAZOLE SODIUM 40 MG PO TBEC
40.0000 mg | DELAYED_RELEASE_TABLET | Freq: Every day | ORAL | Status: DC
  Filled 2021-01-29: qty 1

## 2021-01-29 MED ORDER — SUCRALFATE 1 G PO TABS
1.0000 g | ORAL_TABLET | Freq: Three times a day (TID) | ORAL | Status: DC
  Filled 2021-01-29 (×2): qty 1

## 2021-01-29 MED ORDER — ASPIRIN EC 81 MG PO TBEC: 81.0000 mg | DELAYED_RELEASE_TABLET | Freq: Every day | ORAL | Status: DC

## 2021-01-29 MED ORDER — SODIUM CHLORIDE 0.9% FLUSH: 3.0000 mL | INTRAVENOUS | Status: DC | PRN

## 2021-01-29 MED ORDER — TIMOLOL MALEATE 0.5 % OP SOLN
1.0000 [drp] | Freq: Every day | OPHTHALMIC | Status: DC
  Administered 2021-01-29: 1 [drp] via OPHTHALMIC
  Filled 2021-01-29 (×2): qty 5

## 2021-01-29 MED ORDER — POLYETHYLENE GLYCOL 3350 17 G PO PACK
17.0000 g | PACK | Freq: Every day | ORAL | Status: DC
  Administered 2021-01-29 – 2021-01-31 (×3): 17 g via ORAL
  Filled 2021-01-29 (×3): qty 1

## 2021-01-29 MED ORDER — SODIUM CHLORIDE 0.9 % WEIGHT BASED INFUSION: 1.0000 mL/kg/h | INTRAVENOUS | Status: DC

## 2021-01-29 MED ORDER — METOPROLOL TARTRATE 25 MG PO TABS
25.0000 mg | ORAL_TABLET | Freq: Two times a day (BID) | ORAL | Status: DC
  Administered 2021-01-29 – 2021-01-31 (×5): 25 mg via ORAL
  Filled 2021-01-29 (×5): qty 1

## 2021-01-29 MED ORDER — SODIUM CHLORIDE 0.9 % WEIGHT BASED INFUSION
3.0000 mL/kg/h | INTRAVENOUS | Status: AC
  Administered 2021-01-30: 3 mL/kg/h via INTRAVENOUS

## 2021-01-29 MED ORDER — SODIUM CHLORIDE 0.9 % IV SOLN: 250.0000 mL | INTRAVENOUS | Status: DC | PRN

## 2021-01-29 NOTE — Progress Notes (Signed)
ANTICOAGULATION CONSULT NOTE - Initial Consult  Pharmacy Consult for Heparin  Indication: chest pain/ACS  Allergies  Allergen Reactions  . Bee Venom Swelling  . Amoxicillin Hives  . Sulfamethoxazole-Trimethoprim Nausea Only    Upset stomach    Patient Measurements: Height: 6\' 2"  (188 cm) Weight: 117 kg (257 lb 14.4 oz) IBW/kg (Calculated) : 82.2 Heparin Dosing Weight: 107.4 kg   Vital Signs: Temp: 98.1 F (36.7 C) (02/19 1953) Temp Source: Oral (02/19 1721) BP: 121/64 (02/19 1953) Pulse Rate: 81 (02/19 1953)  Labs: Recent Labs    01/27/21 2327 01/28/21 0042 01/28/21 0120 01/28/21 0347 01/28/21 0605 01/28/21 0605 01/28/21 0750 01/28/21 0900 01/28/21 1924 01/29/21 0125  HGB 15.3  --   --  14.4  --   --   --   --   --  14.1  HCT 44.7  --   --  42.5  --   --   --   --   --  42.0  PLT 194  --   --  214  --   --   --   --   --  171  APTT  --  31  --   --   --   --   --   --   --   --   LABPROT  --  13.2  --  13.8  --   --   --   --   --   --   INR  --  1.0  --  1.1  --   --   --   --   --   --   HEPARINUNFRC  --   --   --   --  >2.20*   < >  --  0.73* 0.37 0.30  CREATININE 1.12  --   --  1.18  --   --   --   --   --   --   TROPONINIHS 5,843*  --    < > 9,856* 12,555*  --  13,572*  --   --   --    < > = values in this interval not displayed.    Estimated Creatinine Clearance: 89.4 mL/min (by C-G formula based on SCr of 1.18 mg/dL).   Medical History: Past Medical History:  Diagnosis Date  . Cancer ([place])    renal cell 2 years ago and bladder cancer 2004   . Chronic kidney disease    right kidney removed 2 years ago renal cell  . Heart murmur   . History of bladder cancer   . Hypothyroidism     Assessment/PTA Med: Pharmacy consulted to dose heparin in this 60 year old male admitted with ACS/NSTEMI.    CrCl = 94.7 ml/min No prior anticoag noted.   First level returned > 2.20, but with troponin's escalating [phone] decided not to hold drip  -  discussed with RN Yong Channel about previous draw which seemed to have possibly been drawn out of the same line heparin was running - RN re-drew heparin level with a hard stick without stopping drip and level returned @ 0.73, only slightly supratherapeutic.   02/19 0900 HL 0.73 heparin infusion reduced to 1400 units/hr 02/19 1924 HL 0.37 , therapeutic x 1  02/20 0130 HL 0.30 , therapeutic X 2   Goal of Therapy:  Heparin level 0.3-0.7 units/ml Monitor platelets by anticoagulation protocol: Yes   Plan:  2/20:  HL @ 0125 = 0.3 Will continue pt on current rate  and recheck HL on 2/21 with AM labs.   [place], Pharm Clinical Pharmacist 01/29/2021 2:30 AM

## 2021-01-29 NOTE — Progress Notes (Signed)
[facility] Cardiology [facility] Encounter Note  Patient: Marc [name] / Admit Date: 01/28/2021 / Date of Encounter: 01/29/2021, 6:04 AM   Subjective: 2/20.  Patient overall has felt well since admission.  No evidence of chest discomfort heart failure or other cardiovascular symptoms.  Patient has been hemodynamically stable with addition of metoprolol.  Patient's troponin has peaked to 13 572 but stable.  Echocardiogram is pending.  We have discussed at length further diagnostics including cardiac catheterization and its risk and benefits as previous and patient still is understanding  Review of Systems: Positive for: None Negative for: Vision change, hearing change, syncope, dizziness, nausea, vomiting,diarrhea, bloody stool, stomach pain, cough, congestion, diaphoresis, urinary frequency, urinary pain,skin lesions, skin rashes Others previously listed  Objective: Telemetry: Normal sinus rhythm Physical Exam: Blood pressure 110/72, pulse 92, temperature 98.4 F (36.9 C), resp. rate 16, height 6\' 2"  (1.88 m), weight 117.2 kg, SpO2 95 %. Body mass index is 33.16 kg/m. General: Well developed, well nourished, in no acute distress. Head: Normocephalic, atraumatic, sclera non-icteric, no xanthomas, nares are without discharge. Neck: No apparent masses Lungs: Normal respirations with no wheezes, no rhonchi, no rales , no crackles   Heart: Regular rate and rhythm, normal S1 S2, no murmur, no rub, no gallop, PMI is normal size and placement, carotid upstroke normal without bruit, jugular venous pressure normal Abdomen: Soft, non-tender, non-distended with normoactive bowel sounds. No hepatosplenomegaly. Abdominal aorta is normal size without bruit Extremities: No edema, no clubbing, no cyanosis, no ulcers,  Peripheral: 2+ radial, 2+ femoral, 2+ dorsal pedal pulses Neuro: Alert and oriented. Moves all extremities spontaneously. Psych:  Responds to questions appropriately with a normal  affect.   Intake/Output Summary (Last 24 hours) at 01/29/2021 0604 Last data filed at 01/29/2021 0354 Gross per 24 hour  Intake 1322.71 ml  Output 300 ml  Net 1022.71 ml    Inpatient Medications:  . aspirin EC  81 mg Oral Daily  . atorvastatin  80 mg Oral Daily  . clopidogrel  75 mg Oral Q breakfast  . metoprolol tartrate  12.5 mg Oral BID  . sodium chloride flush  3 mL Intravenous Q12H   Infusions:  . heparin 1,400 Units/hr (01/28/21 1757)    Labs: Recent Labs    01/27/21 2327 01/28/21 0347  NA 139 138  K 3.8 4.0  CL 107 105  CO2 22 25  GLUCOSE 112* 107*  BUN 27* 25*  CREATININE 1.12 1.18  CALCIUM 9.1 8.8*   No results for input(s): AST, ALT, ALKPHOS, BILITOT, PROT, ALBUMIN in the last 72 hours. Recent Labs    01/28/21 0347 01/29/21 0125  WBC 12.9* 12.2*  HGB 14.4 14.1  HCT 42.5 42.0  MCV 90.4 88.4  PLT 214 171   No results for input(s): CKTOTAL, CKMB, TROPONINI in the last 72 hours. Invalid input(s): POCBNP Recent Labs    01/28/21 0347  HGBA1C 5.4     Weights: Filed Weights   01/27/21 2321 01/28/21 1721 01/29/21 0353  Weight: 118.4 kg 117 kg 117.2 kg     Radiology/Studies:  DG Chest 2 View  Result Date: 01/27/2021 CLINICAL DATA:  Chest pain, shortness of breath EXAM: CHEST - 2 VIEW COMPARISON:  Radiograph 11/01/2014 FINDINGS: Coarsened interstitial changes and bronchitic features appear quite similar to the comparison exam accounting for differences in technique. No new consolidative opacity or convincing features of edema. Cardiac size is top normal. The remaining cardiomediastinal contours are unremarkable. No acute osseous or soft tissue abnormality. Degenerative changes  are present in the imaged spine and shoulders. IMPRESSION: Chronic bronchitic and interstitial changes. No acute cardiopulmonary abnormality. Electronically Signed   By: Lovena Le M.D.   On: 01/27/2021 23:48   DG Chest Portable 1 View  Result Date: 01/28/2021 CLINICAL DATA:   Chest pain. EXAM: PORTABLE CHEST 1 VIEW COMPARISON:  January 27, 2021 (11:41 p.m.) FINDINGS: Mild, chronic appearing increased lung markings are seen. Very mild atelectatic changes are noted within the bilateral lung bases, right slightly greater than left. There is no evidence of a pleural effusion or pneumothorax. The heart size and mediastinal contours are within normal limits. The visualized skeletal structures are unremarkable. IMPRESSION: Very mild bibasilar atelectatic changes. Electronically Signed   By: Virgina Norfolk M.D.   On: 01/28/2021 00:45     Assessment and Recommendation  60 y.o. male with known hypertension hyperlipidemia with acute non-ST elevation myocardial infarction without evidence of congestive heart failure 1.  Continue intravenous heparin and aspirin at this time for further risk reduction and myocardial infarction 2.  Metoprolol for myocardial infarction and need for increased dose today to 25 mg twice per day.  Will consider other treatment options depending on echocardiogram 3.  Cardiac catheterization tomorrow and further treatment options thereafter Signed, Serafina Royals M.D. FACC

## 2021-01-29 NOTE — Progress Notes (Signed)
1        [place] at [place] NAME: Marc [name]    MR#:  [id number]  DATE OF BIRTH:  1961/09/13  SUBJECTIVE:  CHIEF COMPLAINT:   Chief Complaint  Patient presents with  . Chest Pain  Currently chest pain-free.  Getting cath tomorrow.  Wife at bedside with several questions which were answered REVIEW OF SYSTEMS:  Review of Systems  Constitutional: Negative for diaphoresis, fever, malaise/fatigue and weight loss.  HENT: Negative for ear discharge, ear pain, hearing loss, nosebleeds, sore throat and tinnitus.   Eyes: Negative for blurred vision and pain.  Respiratory: Negative for cough, hemoptysis, shortness of breath and wheezing.   Cardiovascular: Positive for chest pain. Negative for palpitations, orthopnea and leg swelling.  Gastrointestinal: Negative for abdominal pain, blood in stool, constipation, diarrhea, heartburn, nausea and vomiting.  Genitourinary: Negative for dysuria, frequency and urgency.  Musculoskeletal: Negative for back pain and myalgias.  Skin: Negative for itching and rash.  Neurological: Negative for dizziness, tingling, tremors, focal weakness, seizures, weakness and headaches.  Psychiatric/Behavioral: Negative for depression. The patient is not nervous/anxious.    DRUG ALLERGIES:   Allergies  Allergen Reactions  . Bee Venom Swelling  . Amoxicillin Hives  . Sulfamethoxazole-Trimethoprim Nausea Only    Upset stomach   VITALS:  Blood pressure 118/81, pulse 76, temperature 97.8 F (36.6 C), temperature source Oral, resp. rate 18, height 6\' 2"  (1.88 m), weight 117.2 kg, SpO2 99 %. PHYSICAL EXAMINATION:  Physical Exam HENT:     Head: Normocephalic and atraumatic.  Eyes:     Extraocular Movements: EOM normal.     Conjunctiva/sclera: Conjunctivae normal.     Pupils: Pupils are equal, round, and reactive to light.  Neck:     Thyroid: No thyromegaly.     Trachea: No tracheal deviation.  Cardiovascular:     Rate and Rhythm:  Normal rate and regular rhythm.     Heart sounds: Normal heart sounds.  Pulmonary:     Effort: Pulmonary effort is normal. No respiratory distress.     Breath sounds: Normal breath sounds. No wheezing.  Chest:     Chest wall: No tenderness.  Abdominal:     General: Bowel sounds are normal. There is no distension.     Palpations: Abdomen is soft.     Tenderness: There is no abdominal tenderness.  Musculoskeletal:        General: Normal range of motion.     Cervical back: Normal range of motion and neck supple.  Skin:    General: Skin is warm and dry.     Findings: No rash.  Neurological:     Mental Status: He is alert and oriented to person, place, and time.     Cranial Nerves: No cranial nerve deficit.    LABORATORY PANEL:  Male CBC Recent Labs  Lab 01/29/21 0125  WBC 12.2*  HGB 14.1  HCT 42.0  PLT 171   ------------------------------------------------------------------------------------------------------------------ Chemistries  Recent Labs  Lab 01/28/21 0347  NA 138  K 4.0  CL 105  CO2 25  GLUCOSE 107*  BUN 25*  CREATININE 1.18  CALCIUM 8.8*   RADIOLOGY:  No results found. ASSESSMENT AND PLAN:  60 year old male with known history of hypothyroidism and hyperlipidemia is admitted for non-STEMI  Non-STEMI Continue heparin drip, aspirin, metoprolol, statin, Plavix Cardiology planning for catheterization tomorrow on 2/21  Hypothyroidism Continue Synthroid  Body mass index is 33.16 kg/m.  Net IO Since Admission: 522.71 mL [01/29/21  1806]      Status is: Inpatient  Remains inpatient appropriate because:Ongoing diagnostic testing needed not appropriate for outpatient work up   Dispo: The patient is from: Home              Anticipated d/c is to: Home              Anticipated d/c date is: 2 days              Patient currently is not medically stable to d/c.   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 Management plans discussed with the patient, family and they are in agreement.  CODE STATUS: Full Code Level of care: Progressive Cardiac  TOTAL TIME TAKING CARE OF THIS PATIENT: 35 minutes.   More than 50% of the time was spent in counseling/coordination of care: YES  POSSIBLE D/C IN 1-2 DAYS, DEPENDING ON CLINICAL CONDITION.   Max Sane M.D on 01/29/2021 at 6:06 PM  [facility]   CC: Primary care physician; [place] Pink, MD  Note: This dictation was prepared with Dragon dictation along with smaller phrase technology. Any transcriptional errors that result from this process are unintentional.

## 2021-01-30 ENCOUNTER — Other Ambulatory Visit: Payer: Self-pay

## 2021-01-30 ENCOUNTER — Encounter
Admission: EM | Disposition: A | Discharge status: Home / Self Care | Payer: Self-pay | Source: Home / Self Care | Attending: Internal Medicine

## 2021-01-30 ENCOUNTER — Encounter: Payer: Self-pay | Admitting: Internal Medicine

## 2021-01-30 HISTORY — PX: LEFT HEART CATH AND CORONARY ANGIOGRAPHY: CATH118249

## 2021-01-30 HISTORY — PX: CORONARY STENT INTERVENTION: CATH118234

## 2021-01-30 LAB — HEPARIN LEVEL (UNFRACTIONATED): Heparin Unfractionated: 0.11 IU/mL — ABNORMAL LOW (ref 0.30–0.70)

## 2021-01-30 LAB — C-REACTIVE PROTEIN: CRP: 16.3 mg/dL — ABNORMAL HIGH (ref <1.0)

## 2021-01-30 LAB — POCT ACTIVATED CLOTTING TIME
Activated Clotting Time: 303 seconds
Activated Clotting Time: 285 seconds

## 2021-01-30 SURGERY — LEFT HEART CATH AND CORONARY ANGIOGRAPHY
Anesthesia: Moderate Sedation

## 2021-01-30 MED ORDER — HEPARIN SODIUM (PORCINE) 1000 UNIT/ML IJ SOLN
INTRAMUSCULAR | Status: DC | PRN
  Administered 2021-01-30: 2000 [IU] via INTRAVENOUS
  Administered 2021-01-30: 5000 [IU] via INTRAVENOUS
  Administered 2021-01-30: 7000 [IU] via INTRAVENOUS

## 2021-01-30 MED ORDER — LISINOPRIL 10 MG PO TABS
10.0000 mg | ORAL_TABLET | Freq: Every day | ORAL | Status: DC
  Administered 2021-01-30 – 2021-01-31 (×2): 10 mg via ORAL
  Filled 2021-01-30 (×2): qty 1

## 2021-01-30 MED ORDER — ONDANSETRON HCL 4 MG/2ML IJ SOLN: 4.0000 mg | Freq: Four times a day (QID) | INTRAMUSCULAR | Status: DC | PRN

## 2021-01-30 MED ORDER — TICAGRELOR 90 MG PO TABS
90.0000 mg | ORAL_TABLET | Freq: Two times a day (BID) | ORAL | Status: DC
  Administered 2021-01-30 – 2021-01-31 (×2): 90 mg via ORAL
  Filled 2021-01-30 (×2): qty 1

## 2021-01-30 MED ORDER — VERAPAMIL HCL 2.5 MG/ML IV SOLN
INTRAVENOUS | Status: AC
  Filled 2021-01-30: qty 2

## 2021-01-30 MED ORDER — ACETAMINOPHEN 325 MG PO TABS: 650.0000 mg | ORAL_TABLET | ORAL | Status: DC | PRN

## 2021-01-30 MED ORDER — HEPARIN SODIUM (PORCINE) 1000 UNIT/ML IJ SOLN
INTRAMUSCULAR | Status: AC
  Filled 2021-01-30: qty 1

## 2021-01-30 MED ORDER — SODIUM CHLORIDE 0.9% FLUSH: 3.0000 mL | INTRAVENOUS | Status: DC | PRN

## 2021-01-30 MED ORDER — MIDAZOLAM HCL 2 MG/2ML IJ SOLN
INTRAMUSCULAR | Status: AC
  Filled 2021-01-30: qty 2

## 2021-01-30 MED ORDER — NITROGLYCERIN 1 MG/10 ML FOR IR/CATH LAB
INTRA_ARTERIAL | Status: DC | PRN
  Administered 2021-01-30: 200 ug via INTRACORONARY

## 2021-01-30 MED ORDER — HEPARIN BOLUS VIA INFUSION
3200.0000 [IU] | Freq: Once | INTRAVENOUS | Status: DC
  Filled 2021-01-30: qty 3200

## 2021-01-30 MED ORDER — LIDOCAINE HCL (PF) 1 % IJ SOLN
INTRAMUSCULAR | Status: DC | PRN
  Administered 2021-01-30: 2 mL

## 2021-01-30 MED ORDER — TICAGRELOR 90 MG PO TABS
ORAL_TABLET | ORAL | Status: AC
  Filled 2021-01-30: qty 2

## 2021-01-30 MED ORDER — HEPARIN (PORCINE) IN NACL 1000-0.9 UT/500ML-% IV SOLN
INTRAVENOUS | Status: DC | PRN
  Administered 2021-01-30 (×2): 500 mL

## 2021-01-30 MED ORDER — LIDOCAINE HCL (PF) 1 % IJ SOLN
INTRAMUSCULAR | Status: AC
  Filled 2021-01-30: qty 30

## 2021-01-30 MED ORDER — SODIUM CHLORIDE 0.9 % IV SOLN: 250.0000 mL | INTRAVENOUS | Status: DC | PRN

## 2021-01-30 MED ORDER — HEPARIN (PORCINE) IN NACL 1000-0.9 UT/500ML-% IV SOLN
INTRAVENOUS | Status: AC
  Filled 2021-01-30: qty 1000

## 2021-01-30 MED ORDER — FENTANYL CITRATE (PF) 100 MCG/2ML IJ SOLN
INTRAMUSCULAR | Status: AC
  Filled 2021-01-30: qty 2

## 2021-01-30 MED ORDER — SODIUM CHLORIDE 0.9 % WEIGHT BASED INFUSION
1.0000 mL/kg/h | INTRAVENOUS | Status: AC
  Administered 2021-01-30: 1 mL/kg/h via INTRAVENOUS

## 2021-01-30 MED ORDER — ASPIRIN 81 MG PO CHEW
81.0000 mg | CHEWABLE_TABLET | Freq: Every day | ORAL | Status: DC
  Administered 2021-01-31: 81 mg via ORAL
  Filled 2021-01-30: qty 1

## 2021-01-30 MED ORDER — VERAPAMIL HCL 2.5 MG/ML IV SOLN
INTRAVENOUS | Status: DC | PRN
  Administered 2021-01-30 (×2): 2.5 mg via INTRA_ARTERIAL

## 2021-01-30 MED ORDER — IOHEXOL 300 MG/ML  SOLN
INTRAMUSCULAR | Status: DC | PRN
  Administered 2021-01-30: 100 mL
  Administered 2021-01-30: 140 mL

## 2021-01-30 MED ORDER — LABETALOL HCL 5 MG/ML IV SOLN: 10.0000 mg | INTRAVENOUS | Status: AC | PRN

## 2021-01-30 MED ORDER — FENTANYL CITRATE (PF) 100 MCG/2ML IJ SOLN
INTRAMUSCULAR | Status: DC | PRN
  Administered 2021-01-30: 25 ug via INTRAVENOUS

## 2021-01-30 MED ORDER — SODIUM CHLORIDE 0.9% FLUSH
3.0000 mL | Freq: Two times a day (BID) | INTRAVENOUS | Status: DC
  Administered 2021-01-30: 3 mL via INTRAVENOUS

## 2021-01-30 MED ORDER — MIDAZOLAM HCL 2 MG/2ML IJ SOLN
INTRAMUSCULAR | Status: DC | PRN
  Administered 2021-01-30: 1 mg via INTRAVENOUS

## 2021-01-30 MED ORDER — TICAGRELOR 90 MG PO TABS: 90.0000 mg | ORAL_TABLET | Freq: Two times a day (BID) | ORAL | Status: DC

## 2021-01-30 MED ORDER — HYDRALAZINE HCL 20 MG/ML IJ SOLN: 10.0000 mg | INTRAMUSCULAR | Status: AC | PRN

## 2021-01-30 MED ORDER — TICAGRELOR 90 MG PO TABS
ORAL_TABLET | ORAL | Status: DC | PRN
  Administered 2021-01-30: 180 mg via ORAL

## 2021-01-30 SURGICAL SUPPLY — 18 items
BALLN TREK RX 2.25X20 (BALLOONS) ×2
BALLN ~~LOC~~ EUPHORA RX 3.25X20 (BALLOONS) ×2
BALLOON TREK RX 2.25X20 (BALLOONS) ×1 IMPLANT
BALLOON ~~LOC~~ EUPHORA RX 3.25X20 (BALLOONS) ×1 IMPLANT
CATH INFINITI 5 FR JL3.5 (CATHETERS) ×2 IMPLANT
CATH INFINITI JR4 5F (CATHETERS) ×2 IMPLANT
CATH VISTA GUIDE 6FR JL3.5 (CATHETERS) ×2 IMPLANT
CATH VISTA GUIDE 6FR XB3.5 (CATHETERS) ×2 IMPLANT
DEVICE RAD TR BAND REGULAR (VASCULAR PRODUCTS) ×2 IMPLANT
GLIDESHEATH SLEND SS 6F .021 (SHEATH) ×2 IMPLANT
GUIDEWIRE INQWIRE 1.5J.035X260 (WIRE) ×1 IMPLANT
INQWIRE 1.5J .035X260CM (WIRE) ×2
KIT ENCORE 26 ADVANTAGE (KITS) ×2 IMPLANT
KIT MANI 3VAL PERCEP (MISCELLANEOUS) ×2 IMPLANT
PACK CARDIAC CATH (CUSTOM PROCEDURE TRAY) ×2 IMPLANT
STENT RESOLUTE ONYX 2.25X12 (Permanent Stent) ×2 IMPLANT
STENT RESOLUTE ONYX 2.75X22 (Permanent Stent) ×2 IMPLANT
WIRE ASAHI PROWATER 180CM (WIRE) ×2 IMPLANT

## 2021-01-30 NOTE — Progress Notes (Signed)
L eye pupil irregular shape implant

## 2021-01-30 NOTE — Progress Notes (Signed)
1        [place] at [place] NAME: Marc [name]    MR#:  [id number]  DATE OF BIRTH:  May 14, 1961  SUBJECTIVE:  CHIEF COMPLAINT:   Chief Complaint  Patient presents with  . Chest Pain  Seen in Cath Lab recovery area.  No chest pain.  Appreciative of his care REVIEW OF SYSTEMS:  Review of Systems  Constitutional: Negative for diaphoresis, fever, malaise/fatigue and weight loss.  HENT: Negative for ear discharge, ear pain, hearing loss, nosebleeds, sore throat and tinnitus.   Eyes: Negative for blurred vision and pain.  Respiratory: Negative for cough, hemoptysis, shortness of breath and wheezing.   Cardiovascular: Negative for palpitations, orthopnea and leg swelling.  Gastrointestinal: Negative for abdominal pain, blood in stool, constipation, diarrhea, heartburn, nausea and vomiting.  Genitourinary: Negative for dysuria, frequency and urgency.  Musculoskeletal: Negative for back pain and myalgias.  Skin: Negative for itching and rash.  Neurological: Negative for dizziness, tingling, tremors, focal weakness, seizures, weakness and headaches.  Psychiatric/Behavioral: Negative for depression. The patient is not nervous/anxious.    DRUG ALLERGIES:   Allergies  Allergen Reactions  . Bee Venom Swelling  . Amoxicillin Hives  . Sulfamethoxazole-Trimethoprim Nausea Only    Upset stomach   VITALS:  Blood pressure (!) 129/97, pulse 73, temperature 98.1 F (36.7 C), temperature source Oral, resp. rate 16, height 6\' 2"  (1.88 m), weight 117.9 kg, SpO2 99 %. PHYSICAL EXAMINATION:  Physical Exam HENT:     Head: Normocephalic and atraumatic.  Eyes:     Conjunctiva/sclera: Conjunctivae normal.     Pupils: Pupils are equal, round, and reactive to light.  Neck:     Thyroid: No thyromegaly.     Trachea: No tracheal deviation.  Cardiovascular:     Rate and Rhythm: Normal rate and regular rhythm.     Heart sounds: Normal heart sounds.  Pulmonary:     Effort:  Pulmonary effort is normal. No respiratory distress.     Breath sounds: Normal breath sounds. No wheezing.  Chest:     Chest wall: No tenderness.  Abdominal:     General: Bowel sounds are normal. There is no distension.     Palpations: Abdomen is soft.     Tenderness: There is no abdominal tenderness.  Musculoskeletal:        General: Normal range of motion.     Cervical back: Normal range of motion and neck supple.  Skin:    General: Skin is warm and dry.     Findings: No rash.  Neurological:     Mental Status: He is alert and oriented to person, place, and time.     Cranial Nerves: No cranial nerve deficit.    LABORATORY PANEL:  Male CBC Recent Labs  Lab 01/29/21 0125  WBC 12.2*  HGB 14.1  HCT 42.0  PLT 171   ------------------------------------------------------------------------------------------------------------------ Chemistries  Recent Labs  Lab 01/28/21 0347  NA 138  K 4.0  CL 105  CO2 25  GLUCOSE 107*  BUN 25*  CREATININE 1.18  CALCIUM 8.8*   RADIOLOGY:  CARDIAC CATHETERIZATION  Result Date: 01/30/2021  Prox LAD to Mid LAD lesion is 30% stenosed.  Mid Cx lesion is 99% stenosed.  Prox RCA lesion is 45% stenosed.  Mid RCA to Dist RCA lesion is 25% stenosed.  A drug-eluting stent was successfully placed using a [place] [id number].  Post intervention, there is a 0% residual stenosis.  A drug-eluting stent was successfully  placed using a STENT RESOLUTE ONYX [id number].  Post intervention, there is a 0% residual stenosis.  Non-stenotic Prox Cx to Mid Cx lesion.  1.  Non-ST elevation myocardial infarction 2.  High-grade segmental 99% stenosis mid left circumflex with ectasia 3.  Successful PCI with overlapping resolute Onyx stents mid left circumflex Recommendations 1.  Dual antiplatelet therapy uninterrupted for 1 year (consider prolonged use due to ectasia) 2.  Aggressive risk factor modification   CARDIAC CATHETERIZATION  Result Date:  01/30/2021  Mid Cx lesion is 99% stenosed.  Prox RCA lesion is 45% stenosed.  Mid RCA to Dist RCA lesion is 25% stenosed.  Prox LAD to Mid LAD lesion is 30% stenosed.  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 Added Brilinta for dual antiplatelet therapy per cardiology which he will likely need lifelong S/p cath on 2/21 showing LV systolic dysfunction with EF of 40%.  Moderate atherosclerosis of LAD and right coronary post PCI with stent placement of left circumflex  Hypothyroidism Continue Synthroid  Body mass index is 33.38 kg/m.  Net IO Since Admission: 564.88 mL [01/30/21 1735]      Status is: Inpatient  Remains inpatient appropriate because:Ongoing diagnostic testing needed not appropriate for outpatient work up   Dispo: The patient is from: Home              Anticipated d/c is to: Home              Anticipated d/c date is: 1 day              Patient currently is not medically stable to d/c.  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 Management plans discussed with the patient, family and they are in agreement.  CODE STATUS: Full Code Level of care: Progressive Cardiac  TOTAL TIME TAKING CARE OF THIS PATIENT: 35 minutes.   More than 50% of the time was spent in counseling/coordination of care: YES  POSSIBLE D/C IN 1 DAYS, DEPENDING ON CLINICAL CONDITION.   Max Sane M.D on 01/30/2021 at 5:35 PM  [facility]   CC: Primary care physician; [place] Pink, MD  Note: This dictation was prepared with Dragon dictation along with smaller phrase technology. Any transcriptional errors that result from this process are unintentional.

## 2021-01-30 NOTE — Progress Notes (Signed)
[facility] Cardiology [facility] Encounter Note  Patient: Marc [name] / Admit Date: 01/28/2021 / Date of Encounter: 01/30/2021, 8:28 AM   Subjective: 2/20.  Patient overall has felt well since admission.  No evidence of chest discomfort heart failure or other cardiovascular symptoms.  Patient has been hemodynamically stable with addition of metoprolol.  Patient's troponin has peaked to 13 572 but stable.  Echocardiogram is pending.  We have discussed at length further diagnostics including cardiac catheterization and its risk and benefits as previous and patient still is understanding  2/21.  Patient has had no evidence of chest discomfort or other symptoms status post non-ST elevation myocardial infarction.  Cardiac catheterization showing mild LV systolic dysfunction with inferior posterior lateral hypokinesis with ejection fraction of 40%.  Additionally he has moderate atherosclerosis of left anterior descending artery and right coronary artery.  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  Patient underwent PCI and stent placement of left circumflex artery without complication and now placed on appropriate medication management  Review of Systems: Positive for: None Negative for: Vision change, hearing change, syncope, dizziness, nausea, vomiting,diarrhea, bloody stool, stomach pain, cough, congestion, diaphoresis, urinary frequency, urinary pain,skin lesions, skin rashes Others previously listed  Objective: Telemetry: Normal sinus rhythm Physical Exam: Blood pressure 132/86, pulse 81, temperature 98.3 F (36.8 C), temperature source Oral, resp. rate 18, height 6\' 2"  (1.88 m), weight 117.9 kg, SpO2 97 %. Body mass index is 33.38 kg/m. General: Well developed, well nourished, in no acute distress. Head: Normocephalic, atraumatic, sclera non-icteric, no xanthomas, nares are without discharge. Neck: No apparent masses Lungs: Normal respirations with no  wheezes, no rhonchi, no rales , no crackles   Heart: Regular rate and rhythm, normal S1 S2, no murmur, no rub, no gallop, PMI is normal size and placement, carotid upstroke normal without bruit, jugular venous pressure normal Abdomen: Soft, non-tender, non-distended with normoactive bowel sounds. No hepatosplenomegaly. Abdominal aorta is normal size without bruit Extremities: No edema, no clubbing, no cyanosis, no ulcers,  Peripheral: 2+ radial, 2+ femoral, 2+ dorsal pedal pulses Neuro: Alert and oriented. Moves all extremities spontaneously. Psych:  Responds to questions appropriately with a normal affect.   Intake/Output Summary (Last 24 hours) at 01/30/2021 0828 Last data filed at 01/30/2021 0643 Gross per 24 hour  Intake 767.17 ml  Output 300 ml  Net 467.17 ml    Inpatient Medications:  . [MAR Hold] aspirin EC  81 mg Oral Daily  . [MAR Hold] atorvastatin  80 mg Oral Daily  . [MAR Hold] clopidogrel  75 mg Oral Q breakfast  . [MAR Hold] heparin  3,200 Units Intravenous Once  . [MAR Hold] levothyroxine  200 mcg Oral Q0600  . [MAR Hold] metoprolol tartrate  25 mg Oral BID  . [MAR Hold] pantoprazole  40 mg Oral Daily  . [MAR Hold] polyethylene glycol  17 g Oral Daily  . [MAR Hold] senna-docusate  2 tablet Oral BID  . [MAR Hold] sodium chloride flush  3 mL Intravenous Q12H  . [MAR Hold] sucralfate  1 g Oral TID WC & HS  . [MAR Hold] timolol  1 drop Left Eye Daily   Infusions:  . sodium chloride    . sodium chloride    . heparin Stopped (01/30/21 0658)    Labs: Recent Labs    01/27/21 2327 01/28/21 0347  NA 139 138  K 3.8 4.0  CL 107 105  CO2 22 25  GLUCOSE 112* 107*  BUN  27* 25*  CREATININE 1.12 1.18  CALCIUM 9.1 8.8*   No results for input(s): AST, ALT, ALKPHOS, BILITOT, PROT, ALBUMIN in the last 72 hours. Recent Labs    01/28/21 0347 01/29/21 0125  WBC 12.9* 12.2*  HGB 14.4 14.1  HCT 42.5 42.0  MCV 90.4 88.4  PLT 214 171   No results for input(s): CKTOTAL,  CKMB, TROPONINI in the last 72 hours. Invalid input(s): POCBNP Recent Labs    01/28/21 0347  HGBA1C 5.4     Weights: Filed Weights   01/29/21 0353 01/30/21 0340 01/30/21 0716  Weight: 117.2 kg 118.5 kg 117.9 kg     Radiology/Studies:  DG Chest 2 View  Result Date: 01/27/2021 CLINICAL DATA:  Chest pain, shortness of breath EXAM: CHEST - 2 VIEW COMPARISON:  Radiograph 11/01/2014 FINDINGS: Coarsened interstitial changes and bronchitic features appear quite similar to the comparison exam accounting for differences in technique. No new consolidative opacity or convincing features of edema. Cardiac size is top normal. The remaining cardiomediastinal contours are unremarkable. No acute osseous or soft tissue abnormality. Degenerative changes are present in the imaged spine and shoulders. IMPRESSION: Chronic bronchitic and interstitial changes. No acute cardiopulmonary abnormality. Electronically Signed   By: Lovena Le M.D.   On: 01/27/2021 23:48   DG Chest Portable 1 View  Result Date: 01/28/2021 CLINICAL DATA:  Chest pain. EXAM: PORTABLE CHEST 1 VIEW COMPARISON:  January 27, 2021 (11:41 p.m.) FINDINGS: Mild, chronic appearing increased lung markings are seen. Very mild atelectatic changes are noted within the bilateral lung bases, right slightly greater than left. There is no evidence of a pleural effusion or pneumothorax. The heart size and mediastinal contours are within normal limits. The visualized skeletal structures are unremarkable. IMPRESSION: Very mild bibasilar atelectatic changes. Electronically Signed   By: Virgina Norfolk M.D.   On: 01/28/2021 00:45     Assessment and Recommendation  60 y.o. male with known hypertension hyperlipidemia with acute non-ST elevation myocardial infarction without evidence of congestive heart failure with cardiac catheterization showing mild LV systolic dysfunction with ejection fraction of 40% and critical stenosis of circumflex artery now status  post PCI and stent placement of circumflex without complication 1.  Dual antiplatelet therapy with Brilinta and aspirin 2.  Metoprolol for myocardial infarction and addition of low-dose ACE inhibitor for myocardial infarction  3.  High intensity cholesterol therapy 4.  Cardiac rehabilitation 5.  Begin ambulation and follow for improvements of symptoms and possible discharge home if feeling well and appropriate medication management and adjustments as per above  signed, Serafina Royals M.D. FACC

## 2021-01-30 NOTE — Progress Notes (Signed)
ANTICOAGULATION CONSULT NOTE - Initial Consult  Pharmacy Consult for Heparin  Indication: chest pain/ACS  Allergies  Allergen Reactions  . Bee Venom Swelling  . Amoxicillin Hives  . Sulfamethoxazole-Trimethoprim Nausea Only    Upset stomach    Patient Measurements: Height: 6\' 2"  (188 cm) Weight: 118.5 kg (261 lb 3.9 oz) IBW/kg (Calculated) : 82.2 Heparin Dosing Weight: 107.4 kg   Vital Signs: Temp: 98.7 F (37.1 C) (02/21 0340) Temp Source: Oral (02/21 0340) BP: 112/59 (02/21 0340) Pulse Rate: 82 (02/21 0340)  Labs: Recent Labs    01/27/21 2327 01/28/21 0042 01/28/21 0120 01/28/21 0347 01/28/21 0605 01/28/21 0750 01/28/21 0900 01/28/21 1924 01/29/21 0125 01/30/21 0559  HGB 15.3  --   --  14.4  --   --   --   --  14.1  --   HCT 44.7  --   --  42.5  --   --   --   --  42.0  --   PLT 194  --   --  214  --   --   --   --  171  --   APTT  --  31  --   --   --   --   --   --   --   --   LABPROT  --  13.2  --  13.8  --   --   --   --   --   --   INR  --  1.0  --  1.1  --   --   --   --   --   --   HEPARINUNFRC  --   --   --   --  >2.20*  --    < > 0.37 0.30 0.11*  CREATININE 1.12  --   --  1.18  --   --   --   --   --   --   TROPONINIHS 5,843*  --    < > 9,856* 12,555* 13,572*  --   --   --   --    < > = values in this interval not displayed.    Estimated Creatinine Clearance: 89.9 mL/min (by C-G formula based on SCr of 1.18 mg/dL).   Medical History: Past Medical History:  Diagnosis Date  . Cancer ([place])    renal cell 2 years ago and bladder cancer 2004   . Chronic kidney disease    right kidney removed 2 years ago renal cell  . Heart murmur   . History of bladder cancer   . Hypothyroidism     Assessment/PTA Med: Pharmacy consulted to dose heparin in this 60 year old male admitted with ACS/NSTEMI.    CrCl = 94.7 ml/min No prior anticoag noted.   First level returned > 2.20, but with troponin's escalating [phone] decided not to hold drip  -  discussed with RN Yong Channel about previous draw which seemed to have possibly been drawn out of the same line heparin was running - RN re-drew heparin level with a hard stick without stopping drip and level returned @ 0.73, only slightly supratherapeutic.   02/19 0900 HL 0.73 heparin infusion reduced to 1400 units/hr 02/19 1924 HL 0.37 , therapeutic x 1  02/20 0130 HL 0.30 , therapeutic X 2   Goal of Therapy:  Heparin level 0.3-0.7 units/ml Monitor platelets by anticoagulation protocol: Yes   Plan:  2/21:  HL @ 0.11  Will order Heparin 3200 units IV X  1 bolus and increase drip rate to 1800 units/hr.  Per RN ,  Pt is scheduled for Cath on 2/21 AM so will probably d/c heparin anyways.  Will recheck HL 6 hrs after rate change.   Delsa Grana Clinical Pharmacist 01/30/2021 6:54 AM

## 2021-01-30 NOTE — ED Provider Notes (Signed)
Marc [name]    CSN: [id number] Arrival date & time: 01/27/21  1551      History   Chief Complaint Chief Complaint  Patient presents with  . Chest Pain    HPI Marc [name] is a 60 y.o. male.   Pt is a 60 year old male that presents with central chest pain, burning with radiation into the right arm. This has been present, waxing and waning over the past 4 or 5 days. The pain has been intermittent. Was seen by PCP and treated for possible GERD without any relief. Taking 40 Prilosec daily.  Some mild SOB at times. No nausea, vomiting, diarrhea. No diaphoresis. No cough, fever. Hx of high cholesterol and renal cancer. No cough, fever.     Chest Pain   Past Medical History:  Diagnosis Date  . Cancer ([place])    renal cell 2 years ago and bladder cancer 2004   . Chronic kidney disease    right kidney removed 2 years ago renal cell  . Heart murmur   . History of bladder cancer   . Hypothyroidism     Patient Active Problem List   Diagnosis Date Noted  . History of renal cell cancer 01/28/2021  . NSTEMI (non-ST elevated myocardial infarction) ([place]) 01/28/2021  . Non-ST elevation MI (NSTEMI) ([place]) 01/28/2021  . Umbilical hernia 92/10/9416  . Encounter for screening for malignant neoplasm of colon 05/19/2019  . Malignant neoplasm of bladder ([place]) 05/19/2019  . Low back strain 05/19/2019  . Influenza vaccination declined 05/19/2019  . Hypercholesteremia 05/19/2019  . Has a tremor 05/19/2019  . Family history of malignant neoplasm of prostate 05/19/2019  . Costal chondritis 05/19/2019  . Acute dermatitis 05/19/2019  . Scrotal abscess 07/24/2017  . LBP (low back pain) 05/30/2016  . Radicular low back pain 05/30/2016  . Back pain 05/30/2016  . Abnormal ECG 05/22/2016  . Hypothyroidism 05/31/2015  . Hydrocele 02/08/2014  . Renal cell carcinoma ([place]) 01/29/2014  . 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 History of bladder cancer  12/08/2012    Past Surgical History:  Procedure Laterality Date  . APPENDECTOMY    . BLADDER SURGERY    . HERNIA REPAIR    . kidney removed Right   . LUMBAR LAMINECTOMY/DECOMPRESSION MICRODISCECTOMY N/A 05/30/2016   Procedure: LUMBAR DECOMPRESSION DISCECTOMY L2-3;  Surgeon: Melina Schools, MD;  Location: [facility];  Service: Orthopedics;  Laterality: N/A;  . TONSILLECTOMY         Home Medications    Prior to Admission medications   Medication Sig Start Date End Date Taking? Authorizing Provider  rosuvastatin (CRESTOR) 10 MG tablet Take 1 tablet by mouth daily. 06/22/20 06/22/21 Yes [provider]  sucralfate (CARAFATE) 1 g tablet Take 1 tablet (1 g total) by mouth 4 (four) times daily -  with meals and at bedtime for 14 days. 01/27/21 02/10/21 Yes Ani Deoliveira A, NP  acetaminophen (TYLENOL) 500 MG tablet Take 1,000 mg by mouth every morning.    [provider]  diphenhydramine-acetaminophen (TYLENOL PM) 25-500 MG TABS tablet Take 2 tablets by mouth at bedtime as needed.    [provider]  levothyroxine (SYNTHROID) 200 MCG tablet Take 200 mcg by mouth daily before breakfast.  01/16/19 01/28/21  [provider]  omeprazole (PRILOSEC) 40 MG capsule Take 40 mg by mouth daily. 01/26/21 01/26/22  [provider]  timolol (TIMOPTIC) 0.5 % ophthalmic solution Place 1 drop into the left eye daily.  02/27/19   [provider]    Family History Family History  Problem Relation Age of Onset  . Heart failure Father     Social History Social History   Tobacco Use  . Smoking status: Former Smoker    Quit date: 12/10/2002    Years since quitting: 18.1  . Smokeless tobacco: Current User    Types: Snuff  Vaping Use  . Vaping Use: Never used  Substance Use Topics  . Alcohol use: No  . Drug use: Yes    Types: Marijuana    Comment: not even a joint a day     Allergies   Bee venom, Amoxicillin, and Sulfamethoxazole-trimethoprim   Review of  Systems Review of Systems  Cardiovascular: Positive for chest pain.     Physical Exam Triage Vital Signs ED Triage Vitals  Enc Vitals Group     BP 01/27/21 1602 (!) 150/99     Pulse Rate 01/27/21 1602 73     Resp 01/27/21 1602 18     Temp 01/27/21 1602 97.8 F (36.6 C)     Temp Source 01/27/21 1602 Oral     SpO2 01/27/21 1602 97 %     Weight --      Height --      Head Circumference --      Peak Flow --      Pain Score 01/27/21 1607 4     Pain Loc --      Pain Edu? --      Excl. in [place]? --    No data found.  Updated Vital Signs BP (!) 150/99 (BP Location: Left Arm)   Pulse 73   Temp 97.8 F (36.6 C) (Oral)   Resp 18   SpO2 97%   Visual Acuity Right Eye Distance:   Left Eye Distance:   Bilateral Distance:    Right Eye Near:   Left Eye Near:    Bilateral Near:     Physical Exam Vitals and nursing note reviewed.  Constitutional:      General: He is not in acute distress.    Appearance: Normal appearance. He is not ill-appearing, toxic-appearing or diaphoretic.  HENT:     Head: Normocephalic and atraumatic.     Nose: Nose normal.  Eyes:     Conjunctiva/sclera: Conjunctivae normal.  Cardiovascular:     Rate and Rhythm: Normal rate and regular rhythm.  Pulmonary:     Effort: Pulmonary effort is normal.     Breath sounds: Normal breath sounds.  Abdominal:     Palpations: Abdomen is soft.     Tenderness: There is no abdominal tenderness.  Musculoskeletal:        General: Normal range of motion.     Cervical back: Normal range of motion.  Skin:    General: Skin is warm and dry.  Neurological:     Mental Status: He is alert.  Psychiatric:        Mood and Affect: Mood normal.      UC Treatments / Results  Labs (all labs ordered are listed, but only abnormal results are displayed) Labs Reviewed - No data to display  EKG   Radiology No results found.  Procedures Procedures (including critical care time)  Medications Ordered in  UC Medications  alum & mag hydroxide-simeth (MAALOX/MYLANTA) 200-200-20 MG/5ML suspension 30 mL (30 mLs Oral Given 01/27/21 1613)    And  lidocaine (XYLOCAINE) 2 % viscous mouth solution 15 mL (15 mLs Oral Given 01/27/21 1613)  Initial Impression / Assessment and Plan / UC Course  I have reviewed the triage vital signs and the nursing notes.  Pertinent labs & imaging results that were available during my care of the patient were reviewed by me and considered in my medical decision making (see chart for details).     Chest pain- this has been ongoing for the past week, intermittent. Has been treating for GERD without much relief.  EKG with normal sinus rhythm and normal rate today. No specific ST elevation or T wave inversion.  Pt symptoms somewhat relieved with GI cocktail.  Spoke with pt about cardiac versus GI related pain.  Will try Carafate and have him continue the Prilosec Maalox and Mylanta as needed.  Strict ER precautions and return precautions given.  Pt understanding and agreed.  Final Clinical Impressions(s) / UC Diagnoses   Final diagnoses:  Chest pain, unspecified type     Discharge Instructions     Your EKG was not concerning  I am prescribing Carafate. Take this as prescribed Prilosec 40 mg daily. Maalox or Mylanta as needed.  ER for worsening symptoms. Otherwise see your doctor Monday.     ED Prescriptions    Medication Sig Dispense Auth. Provider   sucralfate (CARAFATE) 1 g tablet Take 1 tablet (1 g total) by mouth 4 (four) times daily -  with meals and at bedtime for 14 days. 56 tablet Zamantha Strebel A, NP     PDMP not reviewed this encounter.   Loura Halt A, NP 01/30/21 [phone]

## 2021-01-31 LAB — CBC
HCT: 37 % — ABNORMAL LOW (ref 39.0–52.0)
Hemoglobin: 12.8 g/dL — ABNORMAL LOW (ref 13.0–17.0)
MCH: 30.8 pg (ref 26.0–34.0)
MCHC: 34.6 g/dL (ref 30.0–36.0)
MCV: 88.9 fL (ref 80.0–100.0)
Platelets: 161 10*3/uL (ref 150–400)
RBC: 4.16 MIL/uL — ABNORMAL LOW (ref 4.22–5.81)
RDW: 12.5 % (ref 11.5–15.5)
WBC: 8.1 10*3/uL (ref 4.0–10.5)
nRBC: 0 % (ref 0.0–0.2)

## 2021-01-31 LAB — BASIC METABOLIC PANEL
Anion gap: 8 (ref 5–15)
BUN: 21 mg/dL (ref 8–23)
CO2: 23 mmol/L (ref 22–32)
Calcium: 8.7 mg/dL — ABNORMAL LOW (ref 8.9–10.3)
Chloride: 107 mmol/L (ref 98–111)
Creatinine, Ser: 1.17 mg/dL (ref 0.61–1.24)
GFR, Estimated: >60 mL/min (ref >60)
Glucose, Bld: 94 mg/dL (ref 70–99)
Potassium: 3.6 mmol/L (ref 3.5–5.1)
Sodium: 138 mmol/L (ref 135–145)

## 2021-01-31 LAB — C-REACTIVE PROTEIN: CRP: 10.7 mg/dL — ABNORMAL HIGH (ref <1.0)

## 2021-01-31 MED ORDER — METOPROLOL TARTRATE 25 MG PO TABS: 25.0000 mg | ORAL_TABLET | Freq: Two times a day (BID) | ORAL | 0 refills | Status: DC

## 2021-01-31 MED ORDER — ATORVASTATIN CALCIUM 80 MG PO TABS: 80.0000 mg | ORAL_TABLET | Freq: Every day | ORAL | 0 refills | Status: AC

## 2021-01-31 MED ORDER — LISINOPRIL 10 MG PO TABS: 10.0000 mg | ORAL_TABLET | Freq: Every day | ORAL | 0 refills | Status: DC

## 2021-01-31 MED ORDER — ASPIRIN 81 MG PO CHEW: 81.0000 mg | CHEWABLE_TABLET | Freq: Every day | ORAL | 0 refills | Status: AC

## 2021-01-31 MED ORDER — NITROGLYCERIN 0.4 MG SL SUBL: 0.4000 mg | SUBLINGUAL_TABLET | SUBLINGUAL | 0 refills | Status: DC | PRN

## 2021-01-31 MED ORDER — TICAGRELOR 90 MG PO TABS: 90.0000 mg | ORAL_TABLET | Freq: Two times a day (BID) | ORAL | 0 refills | Status: DC

## 2021-01-31 NOTE — TOC Transition Note (Signed)
Transition of Care [facility]) - CM/SW Discharge Note   Patient Details  Name: Marc [name] MRN: [id number] Date of Birth: 1961/05/04  [facility] CM/SW Contact:  Kerin Salen, RN Phone Number: 01/31/2021, 10:17 AM   Clinical Narrative:       Final next level of care: Home/Self Care Barriers to Discharge: Barriers Resolved   Patient Goals and CMS Choice Patient states their goals for this hospitalization and ongoing recovery are:: To return home   Choice offered to / list presented to : NA  Discharge Placement              Patient to be discharged to home, [place] coupon given.     Name of family member notified: Proctor Carriker Patient and family notified of of transfer: 01/31/21  Discharge Plan and Services                DME Arranged: N/A DME Agency: NA       HH Arranged: NA HH Agency: NA        Social Determinants of Health (SDOH) Interventions     Readmission Risk Interventions No flowsheet data found.

## 2021-01-31 NOTE — Discharge Instructions (Signed)
Heart Attack A heart attack occurs when blood and oxygen supply to the heart is cut off. A heart attack causes damage to the heart that cannot be fixed. A heart attack is also called a myocardial infarction, or MI. If you think you are having a heart attack, do not wait to see if the symptoms will go away. Get medical help right away. What are the causes? This condition may be caused by:  A fatty substance (plaque) in the blood vessels (arteries). This can block the flow of blood to the heart.  A blood clot in the blood vessels that go to the heart. The blood clot blocks blood flow.  Low blood pressure.  An abnormal heartbeat.  Some diseases, such as problems in red blood cells (anemia)orproblems in breathing (respiratory failure).  Tightening (spasm) of a blood vessel that cuts off blood to the heart.  A tear in a blood vessel of the heart.  High blood pressure.   What increases the risk? The following factors may make you more likely to develop this condition:  Aging. The older you are, the higher your risk.  Having a personal or family history of chest pain, heart attack, stroke, or narrowing of the arteries in the legs, arms, head, or stomach (peripheral artery disease).  Being male.  Smoking.  Not getting regular exercise.  Being overweight or obese.  Having high blood pressure.  Having high cholesterol.  Having diabetes.  Drinking too much alcohol.  Using illegal drugs, such as cocaine or methamphetamine. What are the signs or symptoms? Symptoms of this condition include:  Chest pain. It may feel like: ? Crushing or squeezing. ? Tightness, pressure, fullness, or heaviness.  Pain in the arm, neck, jaw, back, or upper body.  Shortness of breath.  Heartburn.  Upset stomach (indigestion).  Feeling like you may vomit (nauseous).  Cold sweats.  Feeling tired.  Sudden light-headedness. How is this treated? A heart attack must be treated as soon as  possible. Treatment may include:  Medicines to: ? Break up or dissolve blood clots. ? Thin blood and help prevent blood clots. ? Treat blood pressure. ? Improve blood flow to the heart. ? Reduce pain. ? Reduce cholesterol.  Procedures to widen a blocked artery and keep it open.  Open heart surgery.  Receiving oxygen.  Making your heart strong again (cardiac rehabilitation) through exercise, education, and counseling.   Follow these instructions at home: Medicines  Take over-the-counter and prescription medicines only as told by your doctor. You may need to take medicine: ? To keep your blood from clotting too easily. ? To control blood pressure. ? To lower cholesterol. ? To control heart rhythms.  Do not take these medicines unless your doctor says it is okay: ? NSAIDs, such as ibuprofen. ? Supplements that have vitamin A, vitamin E, or both. ? Hormone replacement therapy that has estrogen with or without progestin. Lifestyle  Do not use any products that have nicotine or tobacco, such as cigarettes, e-cigarettes, and chewing tobacco. If you need help quitting, ask your doctor.  Avoid secondhand smoke.  Exercise regularly. Ask your doctor about a cardiac rehab program.  Eat heart-healthy foods. Your doctor will tell you what foods to eat.  Stay at a healthy weight.  Lower your stress level.  Do not use illegal drugs.      Alcohol use  Do not drink alcohol if: ? Your doctor tells you not to drink. ? You are pregnant, may be pregnant, or   are planning to become pregnant.  If you drink alcohol: ? Limit how much you use to:  0-1 drink a day for women.  0-2 drinks a day for men. ? Know how much alcohol is in your drink. In the [place] one drink equals one 12 oz bottle of beer (355 mL), one 5 oz glass of wine (148 mL), or one 1 oz glass of hard liquor (44 mL). General instructions  Work with your doctor to treat other problems you may have, such as diabetes or  high blood pressure.  Get screened for depression. Get treatment if needed.  Keep your vaccines up to date. Get the flu shot (influenza vaccine) every year.  Keep all follow-up visits as told by your doctor. This is important. Contact a doctor if:  You feel very sad.  You have trouble doing your daily activities. Get help right away if:  You have sudden, unexplained discomfort in your chest, arms, back, neck, jaw, or upper body.  You have shortness of breath.  You have sudden sweating or clammy skin.  You feel like you may vomit.  You vomit.  You feel tired or weak.  You get light-headed or dizzy.  You feel your heart beating fast.  You feel your heart skipping beats.  You have blood pressure that is higher than 180/120. These symptoms may be an emergency. Do not wait to see if the symptoms will go away. Get medical help right away. Call your local emergency services (911 in the [place] Do not drive yourself to the hospital. Summary  A heart attack occurs when blood and oxygen supply to the heart is cut off.  Do not take NSAIDs unless your doctor says it is okay.  Do not smoke. Avoid secondhand smoke.  Exercise regularly. Ask your doctor about a cardiac rehab program. This information is not intended to replace advice given to you by your health care provider. Make sure you discuss any questions you have with your health care provider. Document Revised: 03/09/2019 Document Reviewed: 03/09/2019 Elsevier Patient Education  2021 [employer]  

## 2021-01-31 NOTE — Progress Notes (Signed)
[facility] Cardiology [facility] Encounter Note  Patient: Marc [name] / Admit Date: 01/28/2021 / Date of Encounter: 01/31/2021, 8:29 AM   Subjective: 2/20.  Patient overall has felt well since admission.  No evidence of chest discomfort heart failure or other cardiovascular symptoms.  Patient has been hemodynamically stable with addition of metoprolol.  Patient's troponin has peaked to 13 572 but stable.  Echocardiogram is pending.  We have discussed at length further diagnostics including cardiac catheterization and its risk and benefits as previous and patient still is understanding  2/21.  Patient has had no evidence of chest discomfort or other symptoms status post non-ST elevation myocardial infarction.  Cardiac catheterization showing mild LV systolic dysfunction with inferior posterior lateral hypokinesis with ejection fraction of 40%.  Additionally he has moderate atherosclerosis of left anterior descending artery and right coronary artery.  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  Patient underwent PCI and stent placement of left circumflex artery without complication and now placed on appropriate medication management  2/22.  Patient feels well at this time with no further significant symptoms chest pain shortness of breath or complications from cardiac catheterization and/or further intervention.  Patient is tolerating medication management for myocardial infarction with dual antiplatelet therapy lisinopril metoprolol and high intensity cholesterol therapy  Review of Systems: Positive for: None Negative for: Vision change, hearing change, syncope, dizziness, nausea, vomiting,diarrhea, bloody stool, stomach pain, cough, congestion, diaphoresis, urinary frequency, urinary pain,skin lesions, skin rashes Others previously listed  Objective: Telemetry: Normal sinus rhythm Physical Exam: Blood pressure 129/87, pulse 78, temperature 98.2 F (36.8 C),  resp. rate 17, height 6\' 2"  (1.88 m), weight 118.9 kg, SpO2 98 %. Body mass index is 33.66 kg/m. General: Well developed, well nourished, in no acute distress. Head: Normocephalic, atraumatic, sclera non-icteric, no xanthomas, nares are without discharge. Neck: No apparent masses Lungs: Normal respirations with no wheezes, no rhonchi, no rales , no crackles   Heart: Regular rate and rhythm, normal S1 S2, no murmur, no rub, no gallop, PMI is normal size and placement, carotid upstroke normal without bruit, jugular venous pressure normal Abdomen: Soft, non-tender, non-distended with normoactive bowel sounds. No hepatosplenomegaly. Abdominal aorta is normal size without bruit Extremities: No edema, no clubbing, no cyanosis, no ulcers,  Peripheral: 2+ radial, 2+ femoral, 2+ dorsal pedal pulses Neuro: Alert and oriented. Moves all extremities spontaneously. Psych:  Responds to questions appropriately with a normal affect.   Intake/Output Summary (Last 24 hours) at 01/31/2021 0829 Last data filed at 01/31/2021 0601 Gross per 24 hour  Intake 480 ml  Output 1255 ml  Net -775 ml    Inpatient Medications:  . aspirin  81 mg Oral Daily  . atorvastatin  80 mg Oral Daily  . levothyroxine  200 mcg Oral Q0600  . lisinopril  10 mg Oral Daily  . metoprolol tartrate  25 mg Oral BID  . pantoprazole  40 mg Oral Daily  . polyethylene glycol  17 g Oral Daily  . senna-docusate  2 tablet Oral BID  . sodium chloride flush  3 mL Intravenous Q12H  . sodium chloride flush  3 mL Intravenous Q12H  . sucralfate  1 g Oral TID WC & HS  . ticagrelor  90 mg Oral BID  . timolol  1 drop Left Eye Daily   Infusions:  . sodium chloride      Labs: Recent Labs    01/31/21 0452  NA 138  K 3.6  CL  107  CO2 23  GLUCOSE 94  BUN 21  CREATININE 1.17  CALCIUM 8.7*   No results for input(s): AST, ALT, ALKPHOS, BILITOT, PROT, ALBUMIN in the last 72 hours. Recent Labs    01/29/21 0125 01/31/21 0452  WBC 12.2*  8.1  HGB 14.1 12.8*  HCT 42.0 37.0*  MCV 88.4 88.9  PLT 171 161   No results for input(s): CKTOTAL, CKMB, TROPONINI in the last 72 hours. Invalid input(s): POCBNP No results for input(s): HGBA1C in the last 72 hours.   Weights: Filed Weights   01/30/21 0340 01/30/21 0716 01/31/21 0441  Weight: 118.5 kg 117.9 kg 118.9 kg     Radiology/Studies:  DG Chest 2 View  Result Date: 01/27/2021 CLINICAL DATA:  Chest pain, shortness of breath EXAM: CHEST - 2 VIEW COMPARISON:  Radiograph 11/01/2014 FINDINGS: Coarsened interstitial changes and bronchitic features appear quite similar to the comparison exam accounting for differences in technique. No new consolidative opacity or convincing features of edema. Cardiac size is top normal. The remaining cardiomediastinal contours are unremarkable. No acute osseous or soft tissue abnormality. Degenerative changes are present in the imaged spine and shoulders. IMPRESSION: Chronic bronchitic and interstitial changes. No acute cardiopulmonary abnormality. Electronically Signed   By: Lovena Le M.D.   On: 01/27/2021 23:48   CARDIAC CATHETERIZATION  Result Date: 01/30/2021  Prox LAD to Mid LAD lesion is 30% stenosed.  Mid Cx lesion is 99% stenosed.  Prox RCA lesion is 45% stenosed.  Mid RCA to Dist RCA lesion is 25% stenosed.  A drug-eluting stent was successfully placed using a [place] [id number].  Post intervention, there is a 0% residual stenosis.  A drug-eluting stent was successfully placed using a STENT RESOLUTE ONYX [id number].  Post intervention, there is a 0% residual stenosis.  Non-stenotic Prox Cx to Mid Cx lesion.  1.  Non-ST elevation myocardial infarction 2.  High-grade segmental 99% stenosis mid left circumflex with ectasia 3.  Successful PCI with overlapping resolute Onyx stents mid left circumflex Recommendations 1.  Dual antiplatelet therapy uninterrupted for 1 year (consider prolonged use due to ectasia) 2.  Aggressive risk factor  modification   CARDIAC CATHETERIZATION  Result Date: 01/30/2021  Mid Cx lesion is 99% stenosed.  Prox RCA lesion is 45% stenosed.  Mid RCA to Dist RCA lesion is 25% stenosed.  Prox LAD to Mid LAD lesion is 30% stenosed.  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 EXAM: PORTABLE CHEST 1 VIEW COMPARISON:  January 27, 2021 (11:41 p.m.) FINDINGS: Mild, chronic appearing increased lung markings are seen. Very mild atelectatic changes are noted within the bilateral lung bases, right slightly greater than left. There is no evidence of a pleural effusion or pneumothorax. The heart size and mediastinal contours are within normal limits. The visualized skeletal structures are unremarkable. IMPRESSION: Very mild bibasilar atelectatic changes. Electronically Signed   By: Virgina Norfolk M.D.   On: 01/28/2021 00:45  Assessment and Recommendation  60 y.o. male with known hypertension hyperlipidemia with acute non-ST elevation myocardial infarction without evidence of congestive heart failure with cardiac catheterization showing mild LV systolic dysfunction with ejection fraction of 40% and critical stenosis of circumflex artery now status post PCI and stent placement of circumflex without  complication 1.  Dual antiplatelet therapy with Brilinta and aspirin 2.  Metoprolol for myocardial infarction and  low-dose ACE inhibitor for myocardial infarction  3.  High intensity cholesterol therapy 4.  Cardiac rehabilitation 5.  Begin ambulation and follow for improvements of symptoms and possible discharge home if feeling well and appropriate medication management and adjustments as per above with follow-up in 1 to 2 weeks signed, Serafina Royals M.D. FACC

## 2021-02-01 NOTE — Discharge Summary (Signed)
[place] [facility] [place] NAME: Marc [name]    MR#:  [id number]  DATE OF BIRTH:  23-Nov-1961  DATE OF ADMISSION:  01/28/2021   ADMITTING PHYSICIAN: Para Skeans, MD  DATE OF DISCHARGE: 01/31/2021 10:52 AM  PRIMARY CARE PHYSICIAN: Maryland Pink, MD   ADMISSION DIAGNOSIS:  NSTEMI (non-ST elevated myocardial infarction) ([place]) [I21.4] Non-ST elevation MI (NSTEMI) ([place]) [I21.4] DISCHARGE DIAGNOSIS:  Principal Problem:   NSTEMI (non-ST elevated myocardial infarction) ([place]) Active Problems:   Hypothyroidism   Hypercholesteremia   Non-ST elevation MI (NSTEMI) ([place])  SECONDARY DIAGNOSIS:   Past Medical History:  Diagnosis Date  . Cancer ([place])    renal cell 2 years ago and bladder cancer 2004   . Chronic kidney disease    right kidney removed 2 years ago renal cell  . Heart murmur   . History of bladder cancer   . Hypothyroidism    HOSPITAL COURSE:  60 year old male with known history of hypothyroidism and hyperlipidemia is admitted for non-STEMI  Non-STEMI Continue metoprolol, high intensity statin. Asa + Brilinta as dual antiplatelet therapy likely lifelong S/p cath on 2/21 showing LV systolic dysfunction with EF of 40%.  Moderate atherosclerosis of LAD and right coronary post PCI with stent placement of left circumflex  Hypothyroidism Continue Synthroid   DISCHARGE CONDITIONS:  stable CONSULTS OBTAINED:   DRUG ALLERGIES:   Allergies  Allergen Reactions  . Bee Venom Swelling  . Amoxicillin Hives  . Sulfamethoxazole-Trimethoprim Nausea Only    Upset stomach   DISCHARGE MEDICATIONS:   Allergies as of 01/31/2021      Reactions   Bee Venom Swelling   Amoxicillin Hives   Sulfamethoxazole-trimethoprim Nausea Only   Upset stomach      Medication List    STOP taking these medications   rosuvastatin 10 MG tablet Commonly known as: CRESTOR     TAKE these medications   acetaminophen 500 MG tablet Commonly known as: TYLENOL Take 1,000  mg by mouth every morning.   aspirin 81 MG chewable tablet Chew 1 tablet (81 mg total) by mouth daily.   atorvastatin 80 MG tablet Commonly known as: LIPITOR Take 1 tablet (80 mg total) by mouth daily.   diphenhydramine-acetaminophen 25-500 MG Tabs tablet Commonly known as: TYLENOL PM Take 2 tablets by mouth at bedtime as needed.   levothyroxine 200 MCG tablet Commonly known as: SYNTHROID Take 200 mcg by mouth daily before breakfast.   lisinopril 10 MG tablet Commonly known as: ZESTRIL Take 1 tablet (10 mg total) by mouth daily.   metoprolol tartrate 25 MG tablet Commonly known as: LOPRESSOR Take 1 tablet (25 mg total) by mouth 2 (two) times daily.   nitroGLYCERIN 0.4 MG SL tablet Commonly known as: NITROSTAT Place 1 tablet (0.4 mg total) under the tongue every 5 (five) minutes x 3 doses as needed for chest pain.   omeprazole 40 MG capsule Commonly known as: PRILOSEC Take 40 mg by mouth daily.   sucralfate 1 g tablet Commonly known as: Carafate Take 1 tablet (1 g total) by mouth 4 (four) times daily -  with meals and at bedtime for 14 days.   ticagrelor 90 MG Tabs tablet Commonly known as: BRILINTA Take 1 tablet (90 mg total) by mouth 2 (two) times daily.   timolol 0.5 % ophthalmic solution Commonly known as: TIMOPTIC Place 1 drop into the left eye daily.      DISCHARGE INSTRUCTIONS:   DIET:  Cardiac diet DISCHARGE CONDITION:  Stable ACTIVITY:  Activity as tolerated OXYGEN:  Home Oxygen: No.  Oxygen Delivery: room air DISCHARGE LOCATION:  home   If you experience worsening of your admission symptoms, develop shortness of breath, life threatening emergency, suicidal or homicidal thoughts you must seek medical attention immediately by calling 911 or calling your MD immediately  if symptoms less severe.  You Must read complete instructions/literature along with all the possible adverse reactions/side effects for all the Medicines you take and that have been  prescribed to you. Take any new Medicines after you have completely understood and accpet all the possible adverse reactions/side effects.   Please note  You were cared for by a hospitalist during your hospital stay. If you have any questions about your discharge medications or the care you received while you were in the hospital after you are discharged, you can call the unit and asked to speak with the hospitalist on call if the hospitalist that took care of you is not available. Once you are discharged, your primary care physician will handle any further medical issues. Please note that NO REFILLS for any discharge medications will be authorized once you are discharged, as it is imperative that you return to your primary care physician (or establish a relationship with a primary care physician if you do not have one) for your aftercare needs so that they can reassess your need for medications and monitor your lab values.    On the day of Discharge:  VITAL SIGNS:  Blood pressure 133/86, pulse 71, temperature 98.6 F (37 C), temperature source Oral, resp. rate 16, height 6\' 2"  (1.88 m), weight 118.9 kg, SpO2 99 %. PHYSICAL EXAMINATION:  GENERAL:  60 y.o.-year-old patient lying in the bed with no acute distress.  EYES: Pupils equal, round, reactive to light and accommodation. No scleral icterus. Extraocular muscles intact.  HEENT: Head atraumatic, normocephalic. Oropharynx and nasopharynx clear.  NECK:  Supple, no jugular venous distention. No thyroid enlargement, no tenderness.  LUNGS: Normal breath sounds bilaterally, no wheezing, rales,rhonchi or crepitation. No use of accessory muscles of respiration.  CARDIOVASCULAR: S1, S2 normal. No murmurs, rubs, or gallops.  ABDOMEN: Soft, non-tender, non-distended. Bowel sounds present. No organomegaly or mass.  EXTREMITIES: No pedal edema, cyanosis, or clubbing.  NEUROLOGIC: Cranial nerves II through XII are intact. Muscle strength 5/5 in all  extremities. Sensation intact. Gait not checked.  PSYCHIATRIC: The patient is alert and oriented x 3.  SKIN: No obvious rash, lesion, or ulcer.  DATA REVIEW:   CBC Recent Labs  Lab 01/31/21 0452  WBC 8.1  HGB 12.8*  HCT 37.0*  PLT 161    Chemistries  Recent Labs  Lab 01/31/21 0452  NA 138  K 3.6  CL 107  CO2 23  GLUCOSE 94  BUN 21  CREATININE 1.17  CALCIUM 8.7*     Outpatient follow-up  Follow-up Information    Maryland Pink, MD. Schedule an appointment as soon as possible for a visit on 02/08/2021.   Specialty: Family Medicine Why: @ 9:45am Contact information: [address] [place] [address] [phone]        Corey Skains, MD. Schedule an appointment as soon as possible for a visit on 02/14/2021.   Specialty: Cardiology Why: Patient will need to make a follow up appointment. Contact information: [address] [place] [place] [address] [phone]               30 Day Unplanned Readmission Risk Score  Flowsheet Row ED to Hosp-Admission (Discharged) from 01/28/2021 in [facility]  30 Day Unplanned Readmission Risk Score (%) 15.92 Filed at 01/31/2021 0801     This score is the patient's risk of an unplanned readmission within 30 days of being discharged (0 -100%). The score is based on dignosis, age, lab data, medications, orders, and past utilization.   Low:  0-14.9   Medium: 15-21.9   High: 22-29.9   Extreme: 30 and above         Management plans discussed with the patient, family and they are in agreement.  CODE STATUS: Prior   TOTAL TIME TAKING CARE OF THIS PATIENT: 45 minutes.    Max Sane M.D on 02/01/2021 at 7:49 PM  [facility]   CC: Primary care physician; Maryland Pink, MD   Note: This dictation was prepared with Dragon dictation along with smaller phrase technology. Any transcriptional errors that result from this process are  unintentional.

## 2021-02-02 ENCOUNTER — Encounter: Payer: BC Managed Care – PPO | Attending: Cardiology

## 2021-02-02 ENCOUNTER — Other Ambulatory Visit: Payer: Self-pay

## 2021-02-02 DIAGNOSIS — I214 Non-ST elevation (NSTEMI) myocardial infarction: Secondary | ICD-10-CM

## 2021-02-02 NOTE — Progress Notes (Signed)
Virtual Visit completed. Patient informed on EP and RD appointment and 6 Minute walk test. Patient also informed of patient health questionnaires on My Chart. Patient Verbalizes understanding. Visit diagnosis can be found in [facility] 01/28/2021.

## 2021-02-06 ENCOUNTER — Encounter: Payer: BC Managed Care – PPO | Admitting: *Deleted

## 2021-02-06 ENCOUNTER — Other Ambulatory Visit: Payer: Self-pay

## 2021-02-06 DIAGNOSIS — I214 Non-ST elevation (NSTEMI) myocardial infarction: Secondary | ICD-10-CM

## 2021-02-06 NOTE — Progress Notes (Signed)
Incomplete Session Note  Patient Details  Name: Marc [name] MRN: [id number] Date of Birth: 04-17-1961 Referring Provider:    Yevonne Pax did not complete his rehab session.  He did not want to do program and wants to see what his doctor says about program on Wednesday at his appointment.

## 2021-02-28 ENCOUNTER — Other Ambulatory Visit: Payer: Self-pay

## 2021-02-28 ENCOUNTER — Encounter: Payer: BC Managed Care – PPO | Attending: Cardiology

## 2021-02-28 VITALS — Ht 74.75 in | Wt 255.6 lb

## 2021-02-28 DIAGNOSIS — I214 Non-ST elevation (NSTEMI) myocardial infarction: Secondary | ICD-10-CM | POA: Diagnosis not present

## 2021-02-28 NOTE — Progress Notes (Signed)
Daily Session Note  Patient Details  Name: FERRON ISHMAEL MRN: [id number] Date of Birth: 12-13-1960 Referring Provider:    Encounter Date: 02/28/2021  Check In:  Session Check In - 02/28/21 0714      Check-In   Supervising physician immediately available to respond to emergencies See telemetry face sheet for immediately available ER MD    Location [facility]    Staff Present Birdie Sons, MPA, Elveria Rising, BA, ACSM CEP, Exercise Physiologist;Kara Eliezer Bottom, MS Exercise Physiologist    Virtual Visit No    Medication changes reported     No    Fall or balance concerns reported    No    Warm-up and Cool-down Performed on first and last piece of equipment    Resistance Training Performed Yes    VAD Patient? No    PAD/SET Patient? No      Pain Assessment   Currently in Pain? No/denies              Social History   Tobacco Use  Smoking Status Former Smoker  . Packs/day: 2.00  . Years: 30.00  . Pack years: 60.00  . Types: Cigarettes  . Quit date: 12/10/2002  . Years since quitting: 18.2  Smokeless Tobacco Former Systems developer  . Types: Snuff  . Quit date: 01/11/2016    Goals Met:  Independence with exercise equipment Exercise tolerated well No report of cardiac concerns or symptoms Strength training completed today  Goals Unmet:  Not Applicable  Comments: First full day of exercise!  Patient was oriented to gym and equipment including functions, settings, policies, and procedures.  Patient's individual exercise prescription and treatment plan were reviewed.  All starting workloads were established based on the results of the 6 minute walk test done at initial orientation visit.  The plan for exercise progression was also introduced and progression will be customized based on patient's performance and goals.     Dr. Emily Filbert is Medical Director for [facility].

## 2021-02-28 NOTE — Progress Notes (Signed)
Cardiac Individual Treatment Plan  Patient Details  Name: ELVAN EBRON MRN: [id number] Date of Birth: [birth year] Referring Provider:   Flowsheet Row Cardiac Rehab from 02/28/2021 in [facility] Cardiac and Pulmonary Rehab  Referring Provider Isaias Cowman MD      Initial Encounter Date:  Flowsheet Row Cardiac Rehab from 02/28/2021 in [facility] Cardiac and Pulmonary Rehab  Date 02/28/21      Visit Diagnosis: NSTEMI (non-ST elevated myocardial infarction) [facility])  Patient's Home Medications on Admission:  Current Outpatient Medications:  .  acetaminophen (TYLENOL) 500 MG tablet, Take 1,000 mg by mouth every morning., Disp: , Rfl:  .  aspirin 81 MG chewable tablet, Chew 1 tablet (81 mg total) by mouth daily., Disp: 30 tablet, Rfl: 0 .  atorvastatin (LIPITOR) 80 MG tablet, Take 1 tablet (80 mg total) by mouth daily., Disp: 30 tablet, Rfl: 0 .  diphenhydramine-acetaminophen (TYLENOL PM) 25-500 MG TABS tablet, Take 2 tablets by mouth at bedtime as needed., Disp: , Rfl:  .  levothyroxine (SYNTHROID) 200 MCG tablet, Take 200 mcg by mouth daily before breakfast. , Disp: , Rfl:  .  levothyroxine (SYNTHROID) 200 MCG tablet, TAKE 1 TABLET(200 MCG) BY MOUTH EVERY DAY 30 TO 60 MINUTES BEFORE BREAKFAST ON AN EMPTY STOMACH AND WITH A GLASS OF WATER, Disp: , Rfl:  .  lisinopril (ZESTRIL) 10 MG tablet, Take 1 tablet (10 mg total) by mouth daily., Disp: 30 tablet, Rfl: 0 .  metoprolol tartrate (LOPRESSOR) 25 MG tablet, Take 1 tablet (25 mg total) by mouth 2 (two) times daily., Disp: 60 tablet, Rfl: 0 .  nitroGLYCERIN (NITROSTAT) 0.4 MG SL tablet, Place 1 tablet (0.4 mg total) under the tongue every 5 (five) minutes x 3 doses as needed for chest pain., Disp: 30 tablet, Rfl: 0 .  omeprazole (PRILOSEC) 40 MG capsule, Take 40 mg by mouth daily. (Patient not taking: Reported on 02/02/2021), Disp: , Rfl:  .  sucralfate (CARAFATE) 1 g tablet, Take 1 tablet (1 g total) by mouth 4 (four) times daily -  with meals and at  bedtime for 14 days. (Patient not taking: Reported on 02/02/2021), Disp: 56 tablet, Rfl: 0 .  ticagrelor (BRILINTA) 90 MG TABS tablet, Take 1 tablet (90 mg total) by mouth 2 (two) times daily., Disp: 60 tablet, Rfl: 0 .  timolol (TIMOPTIC) 0.5 % ophthalmic solution, Place 1 drop into the left eye daily., Disp: , Rfl:   Past Medical History: Past Medical History:  Diagnosis Date  . Cancer ([place])    renal cell 2 years ago and bladder cancer 2004   . Chronic kidney disease    right kidney removed 2 years ago renal cell  . Heart murmur   . History of bladder cancer   . Hypothyroidism     Tobacco Use: Social History   Tobacco Use  Smoking Status Former Smoker  . Packs/day: 2.00  . Years: 30.00  . Pack years: 60.00  . Types: Cigarettes  . Quit date: 12/10/2002  . Years since quitting: 18.2  Smokeless Tobacco Former Systems developer  . Types: Snuff  . Quit date: 01/11/2016    Labs: Recent Review Flowsheet Data    Labs for ITP Cardiac and Pulmonary Rehab Latest Ref Rng & Units 01/28/2021   Cholestrol 0 - 200 mg/dL 164   LDLCALC 0 - 99 mg/dL 109(H)   HDL >40 mg/dL 45   Trlycerides <150 mg/dL 49   Hemoglobin A1c 4.8 - 5.6 % 5.4       Exercise Target  Goals: Exercise Program Goal: Individual exercise prescription set using results from initial 6 min walk test and THRR while considering  patient's activity barriers and safety.   Exercise Prescription Goal: Initial exercise prescription builds to 30-45 minutes a day of aerobic activity, 2-3 days per week.  Home exercise guidelines will be given to patient during program as part of exercise prescription that the participant will acknowledge.   Education: Aerobic Exercise: - Group verbal and visual presentation on the components of exercise prescription. Introduces F.I.T.T principle from ACSM for exercise prescriptions.  Reviews F.I.T.T. principles of aerobic exercise including progression. Written material given at graduation. Flowsheet Row  Cardiac Rehab from 02/28/2021 in [facility] Cardiac and Pulmonary Rehab  Education need identified 02/28/21      Education: Resistance Exercise: - Group verbal and visual presentation on the components of exercise prescription. Introduces F.I.T.T principle from ACSM for exercise prescriptions  Reviews F.I.T.T. principles of resistance exercise including progression. Written material given at graduation.    Education: Exercise & Equipment Safety: - Individual verbal instruction and demonstration of equipment use and safety with use of the equipment. Flowsheet Row Cardiac Rehab from 02/28/2021 in [facility] Cardiac and Pulmonary Rehab  Date 02/02/21  Educator [facility]  Instruction Review Code 1- Verbalizes Understanding      Education: Exercise Physiology & General Exercise Guidelines: - Group verbal and written instruction with models to review the exercise physiology of the cardiovascular system and associated critical values. Provides general exercise guidelines with specific guidelines to those with heart or lung disease.    Education: Flexibility, Balance, Mind/Body Relaxation: - Group verbal and visual presentation with interactive activity on the components of exercise prescription. Introduces F.I.T.T principle from ACSM for exercise prescriptions. Reviews F.I.T.T. principles of flexibility and balance exercise training including progression. Also discusses the mind body connection.  Reviews various relaxation techniques to help reduce and manage stress (i.e. Deep breathing, progressive muscle relaxation, and visualization). Balance handout provided to take home. Written material given at graduation.   Activity Barriers & Risk Stratification:  Activity Barriers & Cardiac Risk Stratification - 02/28/21 0814      Activity Barriers & Cardiac Risk Stratification   Activity Barriers Back Problems;Deconditioning;Muscular Weakness;Balance Concerns;Arthritis    Cardiac Risk Stratification Moderate            6 Minute Walk:  6 Minute Walk    Row Name 02/28/21 0813         6 Minute Walk   Phase Initial     Distance 1750 feet     Walk Time 6 minutes     # of Rest Breaks 0     MPH 3.31     METS 4.06     RPE 7     VO2 Peak 14.19     Symptoms No     Resting HR 78 bpm     Resting BP 128/64     Max Ex. HR 110 bpm     Max Ex. BP 132/64            Oxygen Initial Assessment:   Oxygen Re-Evaluation:   Oxygen Discharge (Final Oxygen Re-Evaluation):   Initial Exercise Prescription:  Initial Exercise Prescription - 02/28/21 0800      Date of Initial Exercise RX and Referring Provider   Date 02/28/21    Referring Provider Isaias Cowman MD      Treadmill   MPH 3    Grade 2    Minutes 15    METs 4.12  Recumbant Elliptical   Level 2    RPM 50    Minutes 15    METs 3      REL-XR   Level 3    Speed 50    Minutes 15    METs 3      Prescription Details   Frequency (times per week) 3    Duration Progress to 30 minutes of continuous aerobic without signs/symptoms of physical distress      Intensity   THRR 40-80% of Max Heartrate 110-143    Ratings of Perceived Exertion 11-13    Perceived Dyspnea 0-4      Progression   Progression Continue to progress workloads to maintain intensity without signs/symptoms of physical distress.      Resistance Training   Training Prescription Yes    Weight 5 lb    Reps 10-15           Perform Capillary Blood Glucose checks as needed.  Exercise Prescription Changes:  Exercise Prescription Changes    Row Name 02/28/21 0800             Response to Exercise   Blood Pressure (Admit) 128/64       Blood Pressure (Exercise) 132/64       Heart Rate (Admit) 78 bpm       Heart Rate (Exercise) 110 bpm       Rating of Perceived Exertion (Exercise) 7       Symptoms none       Comments walk test results              Exercise Comments:  Exercise Comments    Row Name 02/28/21 0725           Exercise  Comments First full day of exercise!  Patient was oriented to gym and equipment including functions, settings, policies, and procedures.  Patient's individual exercise prescription and treatment plan were reviewed.  All starting workloads were established based on the results of the 6 minute walk test done at initial orientation visit.  The plan for exercise progression was also introduced and progression will be customized based on patient's performance and goals.              Exercise Goals and Review:  Exercise Goals    Row Name 02/28/21 0816             Exercise Goals   Increase Physical Activity Yes       Intervention Provide advice, education, support and counseling about physical activity/exercise needs.;Develop an individualized exercise prescription for aerobic and resistive training based on initial evaluation findings, risk stratification, comorbidities and participant's personal goals.       Expected Outcomes Short Term: Attend rehab on a regular basis to increase amount of physical activity.;Long Term: Add in home exercise to make exercise part of routine and to increase amount of physical activity.;Long Term: Exercising regularly at least 3-5 days a week.       Increase Strength and Stamina Yes       Intervention Provide advice, education, support and counseling about physical activity/exercise needs.;Develop an individualized exercise prescription for aerobic and resistive training based on initial evaluation findings, risk stratification, comorbidities and participant's personal goals.       Expected Outcomes Short Term: Increase workloads from initial exercise prescription for resistance, speed, and METs.;Short Term: Perform resistance training exercises routinely during rehab and add in resistance training at home;Long Term: Improve cardiorespiratory fitness, muscular endurance and strength as measured  by increased METs and functional capacity (6MWT)       Able to understand  and use rate of perceived exertion (RPE) scale Yes       Intervention Provide education and explanation on how to use RPE scale       Expected Outcomes Short Term: Able to use RPE daily in rehab to express subjective intensity level;Long Term:  Able to use RPE to guide intensity level when exercising independently       Able to understand and use Dyspnea scale Yes       Intervention Provide education and explanation on how to use Dyspnea scale       Expected Outcomes Long Term: Able to use Dyspnea scale to guide intensity level when exercising independently;Short Term: Able to use Dyspnea scale daily in rehab to express subjective sense of shortness of breath during exertion       Knowledge and understanding of Target Heart Rate Range (THRR) Yes       Intervention Provide education and explanation of THRR including how the numbers were predicted and where they are located for reference       Expected Outcomes Short Term: Able to state/look up THRR;Long Term: Able to use THRR to govern intensity when exercising independently;Short Term: Able to use daily as guideline for intensity in rehab       Able to check pulse independently Yes       Intervention Provide education and demonstration on how to check pulse in carotid and radial arteries.;Review the importance of being able to check your own pulse for safety during independent exercise       Expected Outcomes Short Term: Able to explain why pulse checking is important during independent exercise;Long Term: Able to check pulse independently and accurately       Understanding of Exercise Prescription Yes       Intervention Provide education, explanation, and written materials on patient's individual exercise prescription       Expected Outcomes Short Term: Able to explain program exercise prescription;Long Term: Able to explain home exercise prescription to exercise independently              Exercise Goals Re-Evaluation :  Exercise Goals  Re-Evaluation    Row Name 02/28/21 0725             Exercise Goal Re-Evaluation   Exercise Goals Review Increase Physical Activity;Able to understand and use rate of perceived exertion (RPE) scale;Knowledge and understanding of Target Heart Rate Range (THRR);Understanding of Exercise Prescription;Increase Strength and Stamina;Able to understand and use Dyspnea scale;Able to check pulse independently       Comments Reviewed RPE and dyspnea scales, THR and program prescription with pt today.  Pt voiced understanding and was given a copy of goals to take home.       Expected Outcomes Short: Use RPE daily to regulate intensity. Long: Follow program prescription in THR.              Discharge Exercise Prescription (Final Exercise Prescription Changes):  Exercise Prescription Changes - 02/28/21 0800      Response to Exercise   Blood Pressure (Admit) 128/64    Blood Pressure (Exercise) 132/64    Heart Rate (Admit) 78 bpm    Heart Rate (Exercise) 110 bpm    Rating of Perceived Exertion (Exercise) 7    Symptoms none    Comments walk test results           Nutrition:  Target Goals:  Understanding of nutrition guidelines, daily intake of sodium 1500mg , cholesterol 200mg , calories 30% from fat and 7% or less from saturated fats, daily to have 5 or more servings of fruits and vegetables.  Education: All About Nutrition: -Group instruction provided by verbal, written material, interactive activities, discussions, models, and posters to present general guidelines for heart healthy nutrition including fat, fiber, MyPlate, the role of sodium in heart healthy nutrition, utilization of the nutrition label, and utilization of this knowledge for meal planning. Follow up email sent as well. Written material given at graduation.   Biometrics:  Pre Biometrics - 02/28/21 0817      Pre Biometrics   Height 6' 2.75" (1.899 m)    Weight 255 lb 9.6 oz (115.9 kg)    BMI (Calculated) 32.15    Single  Leg Stand 11.1 seconds            Nutrition Therapy Plan and Nutrition Goals:   Nutrition Assessments:  MEDIFICTS Score Key:  ?70 Need to make dietary changes   40-70 Heart Healthy Diet  ? 40 Therapeutic Level Cholesterol Diet  Flowsheet Row Cardiac Rehab from 02/28/2021 in [facility] Cardiac and Pulmonary Rehab  Picture Your Plate Total Score on Admission 73     Picture Your Plate Scores:  <04 Unhealthy dietary pattern with much room for improvement.  41-50 Dietary pattern unlikely to meet recommendations for good health and room for improvement.  51-60 More healthful dietary pattern, with some room for improvement.   >60 Healthy dietary pattern, although there may be some specific behaviors that could be improved.    Nutrition Goals Re-Evaluation:   Nutrition Goals Discharge (Final Nutrition Goals Re-Evaluation):   Psychosocial: Target Goals: Acknowledge presence or absence of significant depression and/or stress, maximize coping skills, provide positive support system. Participant is able to verbalize types and ability to use techniques and skills needed for reducing stress and depression.   Education: Stress, Anxiety, and Depression - Group verbal and visual presentation to define topics covered.  Reviews how body is impacted by stress, anxiety, and depression.  Also discusses healthy ways to reduce stress and to treat/manage anxiety and depression.  Written material given at graduation.   Education: Sleep Hygiene -Provides group verbal and written instruction about how sleep can affect your health.  Define sleep hygiene, discuss sleep cycles and impact of sleep habits. Review good sleep hygiene tips.    Initial Review & Psychosocial Screening:  Initial Psych Review & Screening - 02/02/21 0909      Initial Review   Current issues with Current Stress Concerns    Comments He works for himself and states that his wife thinks he is stressed from his job.       Family Dynamics   Good Support System? Yes    Comments He can look to his wife, good friends and brother In-law for support.      Barriers   Psychosocial barriers to participate in program The patient should benefit from training in stress management and relaxation.;There are no identifiable barriers or psychosocial needs.      Screening Interventions   Interventions To provide support and resources with identified psychosocial needs;Encouraged to exercise;Provide feedback about the scores to participant    Expected Outcomes Short Term goal: Utilizing psychosocial counselor, staff and physician to assist with identification of specific Stressors or current issues interfering with healing process. Setting desired goal for each stressor or current issue identified.;Long Term Goal: Stressors or current issues are controlled or eliminated.;Short Term  goal: Identification and review with participant of any Quality of Life or Depression concerns found by scoring the questionnaire.;Long Term goal: The participant improves quality of Life and PHQ9 Scores as seen by post scores and/or verbalization of changes           Quality of Life Scores:   Quality of Life - 02/28/21 0817      Quality of Life   Select Quality of Life      Quality of Life Scores   Health/Function Pre 29.1 %    Socioeconomic Pre 30 %    Psych/Spiritual Pre 30 %    Family Pre 30 %    GLOBAL Pre 29.6 %          Scores of 19 and below usually indicate a poorer quality of life in these areas.  A difference of  2-3 points is a clinically meaningful difference.  A difference of 2-3 points in the total score of the Quality of Life Index has been associated with significant improvement in overall quality of life, self-image, physical symptoms, and general health in studies assessing change in quality of life.  PHQ-9: Recent Review Flowsheet Data    Depression screen [facility] 2/9 02/28/2021   Decreased Interest 0   Down, Depressed,  Hopeless 0   PHQ - 2 Score 0   Altered sleeping 0   Tired, decreased energy 1   Change in appetite 0   Feeling bad or failure about yourself  0   Trouble concentrating 0   Moving slowly or fidgety/restless 0   Suicidal thoughts 0   PHQ-9 Score 1   Difficult doing work/chores Not difficult at all     Interpretation of Total Score  Total Score Depression Severity:  1-4 = Minimal depression, 5-9 = Mild depression, 10-14 = Moderate depression, 15-19 = Moderately severe depression, 20-27 = Severe depression   Psychosocial Evaluation and Intervention:  Psychosocial Evaluation - 02/02/21 0911      Psychosocial Evaluation & Interventions   Interventions Encouraged to exercise with the program and follow exercise prescription;Relaxation education;Stress management education    Comments He can look to his wife, good friends and brother In-law for support.He can look to his wife, good friends and brother In-law for support.    Expected Outcomes Short: Exercise regularly to support mental health and notify staff of any changes. Long: maintain mental health and well being through teaching of rehab or prescribed medications independently.           Psychosocial Re-Evaluation:   Psychosocial Discharge (Final Psychosocial Re-Evaluation):   Vocational Rehabilitation: Provide vocational rehab assistance to qualifying candidates.   Vocational Rehab Evaluation & Intervention:   Education: Education Goals: Education classes will be provided on a variety of topics geared toward better understanding of heart health and risk factor modification. Participant will state understanding/return demonstration of topics presented as noted by education test scores.  Learning Barriers/Preferences:  Learning Barriers/Preferences - 02/02/21 0909      Learning Barriers/Preferences   Learning Barriers None    Learning Preferences None           General Cardiac Education Topics:  AED/CPR: - Group  verbal and written instruction with the use of models to demonstrate the basic use of the AED with the basic ABC's of resuscitation.   Anatomy and Cardiac Procedures: - Group verbal and visual presentation and models provide information about basic cardiac anatomy and function. Reviews the testing methods done to diagnose heart disease and the outcomes  of the test results. Describes the treatment choices: Medical Management, Angioplasty, or Coronary Bypass Surgery for treating various heart conditions including Myocardial Infarction, Angina, Valve Disease, and Cardiac Arrhythmias.  Written material given at graduation. Flowsheet Row Cardiac Rehab from 02/28/2021 in [facility] Cardiac and Pulmonary Rehab  Education need identified 02/28/21      Medication Safety: - Group verbal and visual instruction to review commonly prescribed medications for heart and lung disease. Reviews the medication, class of the drug, and side effects. Includes the steps to properly store meds and maintain the prescription regimen.  Written material given at graduation.   Intimacy: - Group verbal instruction through game format to discuss how heart and lung disease can affect sexual intimacy. Written material given at graduation..   Know Your Numbers and Heart Failure: - Group verbal and visual instruction to discuss disease risk factors for cardiac and pulmonary disease and treatment options.  Reviews associated critical values for Overweight/Obesity, Hypertension, Cholesterol, and Diabetes.  Discusses basics of heart failure: signs/symptoms and treatments.  Introduces Heart Failure Zone chart for action plan for heart failure.  Written material given at graduation.   Infection Prevention: - Provides verbal and written material to individual with discussion of infection control including proper hand washing and proper equipment cleaning during exercise session. Flowsheet Row Cardiac Rehab from 02/28/2021 in [facility] Cardiac and  Pulmonary Rehab  Date 02/02/21  Educator [facility]  Instruction Review Code 1- Verbalizes Understanding      Falls Prevention: - Provides verbal and written material to individual with discussion of falls prevention and safety. Flowsheet Row Cardiac Rehab from 02/28/2021 in [facility] Cardiac and Pulmonary Rehab  Date 02/02/21  Educator [facility]  Instruction Review Code 1- Verbalizes Understanding      Other: -Provides group and verbal instruction on various topics (see comments)   Knowledge Questionnaire Score:  Knowledge Questionnaire Score - 02/28/21 0818      Knowledge Questionnaire Score   Pre Score 24/26 Education Focus: angina, exercise           Core Components/Risk Factors/Patient Goals at Admission:  Personal Goals and Risk Factors at Admission - 02/28/21 0819      Core Components/Risk Factors/Patient Goals on Admission    Weight Management Yes;Weight Loss;Obesity    Intervention Weight Management: Develop a combined nutrition and exercise program designed to reach desired caloric intake, while maintaining appropriate intake of nutrient and fiber, sodium and fats, and appropriate energy expenditure required for the weight goal.;Weight Management: Provide education and appropriate resources to help participant work on and attain dietary goals.;Weight Management/Obesity: Establish reasonable short term and long term weight goals.;Obesity: Provide education and appropriate resources to help participant work on and attain dietary goals.    Admit Weight 255 lb 9.6 oz (115.9 kg)    Goal Weight: Short Term 250 lb (113.4 kg)    Goal Weight: Long Term 245 lb (111.1 kg)    Expected Outcomes Short Term: Continue to assess and modify interventions until short term weight is achieved;Long Term: Adherence to nutrition and physical activity/exercise program aimed toward attainment of established weight goal;Weight Loss: Understanding of general recommendations for a balanced deficit meal plan, which  promotes 1-2 lb weight loss per week and includes a negative energy balance of [phone] kcal/d;Understanding recommendations for meals to include 15-35% energy as protein, 25-35% energy from fat, 35-60% energy from carbohydrates, less than 200mg  of dietary cholesterol, 20-35 gm of total fiber daily;Understanding of distribution of calorie intake throughout the day with the consumption of  4-5 meals/snacks    Hypertension Yes    Intervention Monitor prescription use compliance.;Provide education on lifestyle modifcations including regular physical activity/exercise, weight management, moderate sodium restriction and increased consumption of fresh fruit, vegetables, and low fat dairy, alcohol moderation, and smoking cessation.    Expected Outcomes Short Term: Continued assessment and intervention until BP is < 140/28mm HG in hypertensive participants. < 130/51mm HG in hypertensive participants with diabetes, heart failure or chronic kidney disease.;Long Term: Maintenance of blood pressure at goal levels.    Lipids Yes    Intervention Provide education and support for participant on nutrition & aerobic/resistive exercise along with prescribed medications to achieve LDL 70mg , HDL >40mg .    Expected Outcomes Short Term: Participant states understanding of desired cholesterol values and is compliant with medications prescribed. Participant is following exercise prescription and nutrition guidelines.;Long Term: Cholesterol controlled with medications as prescribed, with individualized exercise RX and with personalized nutrition plan. Value goals: LDL < 70mg , HDL > 40 mg.           Education:Diabetes - Individual verbal and written instruction to review signs/symptoms of diabetes, desired ranges of glucose level fasting, after meals and with exercise. Acknowledge that pre and post exercise glucose checks will be done for 3 sessions at entry of program.   Core Components/Risk Factors/Patient Goals Review:     Core Components/Risk Factors/Patient Goals at Discharge (Final Review):    ITP Comments:  ITP Comments    Row Name 02/02/21 0913 02/06/21 0904 02/28/21 0724 02/28/21 0812     ITP Comments Virtual Visit completed. Patient informed on EP and RD appointment and 6 Minute walk test. Patient also informed of patient health questionnaires on My Chart. Patient Verbalizes understanding. Visit diagnosis can be found in [facility] 01/28/[facility]. Pt had been having some issues with low blood pressure over the weekend.  He has a follow up appt on Wednesday with Dr. Saralyn Pilar.  He did not want to do the program and was very hot, sweaty on arrival.  He did not do his walk test today and will see what Dr. Saralyn Pilar says about program.  We will hold his paperwork for the week. First full day of exercise!  Patient was oriented to gym and equipment including functions, settings, policies, and procedures.  Patient's individual exercise prescription and treatment plan were reviewed.  All starting workloads were established based on the results of the 6 minute walk test done at initial orientation visit.  The plan for exercise progression was also introduced and progression will be customized based on patient's performance and goals. Jaivian came in for first day and walk test today.Completed 6MWT and gym orientation. Initial ITP created and sent for review to Dr. Emily Filbert, Medical Director.           Comments: Initial ITP

## 2021-02-28 NOTE — Patient Instructions (Signed)
Patient Instructions  Patient Details  Name: Marc [name] MRN: [id number] Date of Birth: Oct 15, 1961 Referring Provider:  Isaias Cowman, MD  Below are your personal goals for exercise, nutrition, and risk factors. Our goal is to help you stay on track towards obtaining and maintaining these goals. We will be discussing your progress on these goals with you throughout the program.  Initial Exercise Prescription:  Initial Exercise Prescription - 02/28/21 0800      Date of Initial Exercise RX and Referring Provider   Date 02/28/21    Referring Provider Paraschos, Alexander MD      Treadmill   MPH 3    Grade 2    Minutes 15    METs 4.12      Recumbant Elliptical   Level 2    RPM 50    Minutes 15    METs 3      REL-XR   Level 3    Speed 50    Minutes 15    METs 3      Prescription Details   Frequency (times per week) 3    Duration Progress to 30 minutes of continuous aerobic without signs/symptoms of physical distress      Intensity   THRR 40-80% of Max Heartrate 110-143    Ratings of Perceived Exertion 11-13    Perceived Dyspnea 0-4      Progression   Progression Continue to progress workloads to maintain intensity without signs/symptoms of physical distress.      Resistance Training   Training Prescription Yes    Weight 5 lb    Reps 10-15           Exercise Goals: Frequency: Be able to perform aerobic exercise two to three times per week in program working toward 2-5 days per week of home exercise.  Intensity: Work with a perceived exertion of 11 (fairly light) - 15 (hard) while following your exercise prescription.  We will make changes to your prescription with you as you progress through the program.   Duration: Be able to do 30 to 45 minutes of continuous aerobic exercise in addition to a 5 minute warm-up and a 5 minute cool-down routine.   Nutrition Goals: Your personal nutrition goals will be established when you do your nutrition analysis with  the dietician.  The following are general nutrition guidelines to follow: Cholesterol < 200mg /day Sodium < 1500mg /day Fiber: Men over 50 yrs - 30 grams per day  Personal Goals:  Personal Goals and Risk Factors at Admission - 02/28/21 0819      Core Components/Risk Factors/Patient Goals on Admission    Weight Management Yes;Weight Loss;Obesity    Intervention Weight Management: Develop a combined nutrition and exercise program designed to reach desired caloric intake, while maintaining appropriate intake of nutrient and fiber, sodium and fats, and appropriate energy expenditure required for the weight goal.;Weight Management: Provide education and appropriate resources to help participant work on and attain dietary goals.;Weight Management/Obesity: Establish reasonable short term and long term weight goals.;Obesity: Provide education and appropriate resources to help participant work on and attain dietary goals.    Admit Weight 255 lb 9.6 oz (115.9 kg)    Goal Weight: Short Term 250 lb (113.4 kg)    Goal Weight: Long Term 245 lb (111.1 kg)    Expected Outcomes Short Term: Continue to assess and modify interventions until short term weight is achieved;Long Term: Adherence to nutrition and physical activity/exercise program aimed toward attainment of established weight goal;Weight Loss:  Understanding of general recommendations for a balanced deficit meal plan, which promotes 1-2 lb weight loss per week and includes a negative energy balance of [phone] kcal/d;Understanding recommendations for meals to include 15-35% energy as protein, 25-35% energy from fat, 35-60% energy from carbohydrates, less than 200mg  of dietary cholesterol, 20-35 gm of total fiber daily;Understanding of distribution of calorie intake throughout the day with the consumption of 4-5 meals/snacks    Hypertension Yes    Intervention Monitor prescription use compliance.;Provide education on lifestyle modifcations including regular  physical activity/exercise, weight management, moderate sodium restriction and increased consumption of fresh fruit, vegetables, and low fat dairy, alcohol moderation, and smoking cessation.    Expected Outcomes Short Term: Continued assessment and intervention until BP is < 140/59mm HG in hypertensive participants. < 130/31mm HG in hypertensive participants with diabetes, heart failure or chronic kidney disease.;Long Term: Maintenance of blood pressure at goal levels.    Lipids Yes    Intervention Provide education and support for participant on nutrition & aerobic/resistive exercise along with prescribed medications to achieve LDL 70mg , HDL >40mg .    Expected Outcomes Short Term: Participant states understanding of desired cholesterol values and is compliant with medications prescribed. Participant is following exercise prescription and nutrition guidelines.;Long Term: Cholesterol controlled with medications as prescribed, with individualized exercise RX and with personalized nutrition plan. Value goals: LDL < 70mg , HDL > 40 mg.           Tobacco Use Initial Evaluation: Social History   Tobacco Use  Smoking Status Former Smoker  . Packs/day: 2.00  . Years: 30.00  . Pack years: 60.00  . Types: Cigarettes  . Quit date: 12/10/2002  . Years since quitting: 18.2  Smokeless Tobacco Former Systems developer  . Types: Snuff  . Quit date: 01/11/2016    Exercise Goals and Review:  Exercise Goals    Row Name 02/28/21 0816             Exercise Goals   Increase Physical Activity Yes       Intervention Provide advice, education, support and counseling about physical activity/exercise needs.;Develop an individualized exercise prescription for aerobic and resistive training based on initial evaluation findings, risk stratification, comorbidities and participant's personal goals.       Expected Outcomes Short Term: Attend rehab on a regular basis to increase amount of physical activity.;Long Term: Add in home  exercise to make exercise part of routine and to increase amount of physical activity.;Long Term: Exercising regularly at least 3-5 days a week.       Increase Strength and Stamina Yes       Intervention Provide advice, education, support and counseling about physical activity/exercise needs.;Develop an individualized exercise prescription for aerobic and resistive training based on initial evaluation findings, risk stratification, comorbidities and participant's personal goals.       Expected Outcomes Short Term: Increase workloads from initial exercise prescription for resistance, speed, and METs.;Short Term: Perform resistance training exercises routinely during rehab and add in resistance training at home;Long Term: Improve cardiorespiratory fitness, muscular endurance and strength as measured by increased METs and functional capacity (6MWT)       Able to understand and use rate of perceived exertion (RPE) scale Yes       Intervention Provide education and explanation on how to use RPE scale       Expected Outcomes Short Term: Able to use RPE daily in rehab to express subjective intensity level;Long Term:  Able to use RPE to guide intensity level when  exercising independently       Able to understand and use Dyspnea scale Yes       Intervention Provide education and explanation on how to use Dyspnea scale       Expected Outcomes Long Term: Able to use Dyspnea scale to guide intensity level when exercising independently;Short Term: Able to use Dyspnea scale daily in rehab to express subjective sense of shortness of breath during exertion       Knowledge and understanding of Target Heart Rate Range (THRR) Yes       Intervention Provide education and explanation of THRR including how the numbers were predicted and where they are located for reference       Expected Outcomes Short Term: Able to state/look up THRR;Long Term: Able to use THRR to govern intensity when exercising independently;Short Term:  Able to use daily as guideline for intensity in rehab       Able to check pulse independently Yes       Intervention Provide education and demonstration on how to check pulse in carotid and radial arteries.;Review the importance of being able to check your own pulse for safety during independent exercise       Expected Outcomes Short Term: Able to explain why pulse checking is important during independent exercise;Long Term: Able to check pulse independently and accurately       Understanding of Exercise Prescription Yes       Intervention Provide education, explanation, and written materials on patient's individual exercise prescription       Expected Outcomes Short Term: Able to explain program exercise prescription;Long Term: Able to explain home exercise prescription to exercise independently              Copy of goals given to participant.

## 2021-03-01 ENCOUNTER — Encounter: Payer: Self-pay | Admitting: *Deleted

## 2021-03-01 DIAGNOSIS — I214 Non-ST elevation (NSTEMI) myocardial infarction: Secondary | ICD-10-CM

## 2021-03-01 NOTE — Progress Notes (Signed)
Cardiac Individual Treatment Plan  Patient Details  Name: Marc [name] MRN: [id number] Date of Birth: July 15, 1961 Referring Provider:   Flowsheet Row Cardiac Rehab from 02/28/2021 in [facility] Cardiac and Pulmonary Rehab  Referring Provider Isaias Cowman MD      Initial Encounter Date:  Flowsheet Row Cardiac Rehab from 02/28/2021 in [facility] Cardiac and Pulmonary Rehab  Date 02/28/21      Visit Diagnosis: NSTEMI (non-ST elevated myocardial infarction) [facility])  Patient's Home Medications on Admission:  Current Outpatient Medications:  .  acetaminophen (TYLENOL) 500 MG tablet, Take 1,000 mg by mouth every morning., Disp: , Rfl:  .  aspirin 81 MG chewable tablet, Chew 1 tablet (81 mg total) by mouth daily., Disp: 30 tablet, Rfl: 0 .  atorvastatin (LIPITOR) 80 MG tablet, Take 1 tablet (80 mg total) by mouth daily., Disp: 30 tablet, Rfl: 0 .  diphenhydramine-acetaminophen (TYLENOL PM) 25-500 MG TABS tablet, Take 2 tablets by mouth at bedtime as needed., Disp: , Rfl:  .  levothyroxine (SYNTHROID) 200 MCG tablet, Take 200 mcg by mouth daily before breakfast. , Disp: , Rfl:  .  levothyroxine (SYNTHROID) 200 MCG tablet, TAKE 1 TABLET(200 MCG) BY MOUTH EVERY DAY 30 TO 60 MINUTES BEFORE BREAKFAST ON AN EMPTY STOMACH AND WITH A GLASS OF WATER, Disp: , Rfl:  .  lisinopril (ZESTRIL) 10 MG tablet, Take 1 tablet (10 mg total) by mouth daily., Disp: 30 tablet, Rfl: 0 .  metoprolol tartrate (LOPRESSOR) 25 MG tablet, Take 1 tablet (25 mg total) by mouth 2 (two) times daily., Disp: 60 tablet, Rfl: 0 .  nitroGLYCERIN (NITROSTAT) 0.4 MG SL tablet, Place 1 tablet (0.4 mg total) under the tongue every 5 (five) minutes x 3 doses as needed for chest pain., Disp: 30 tablet, Rfl: 0 .  omeprazole (PRILOSEC) 40 MG capsule, Take 40 mg by mouth daily. (Patient not taking: Reported on 02/02/2021), Disp: , Rfl:  .  sucralfate (CARAFATE) 1 g tablet, Take 1 tablet (1 g total) by mouth 4 (four) times daily -  with meals and at  bedtime for 14 days. (Patient not taking: Reported on 02/02/2021), Disp: 56 tablet, Rfl: 0 .  ticagrelor (BRILINTA) 90 MG TABS tablet, Take 1 tablet (90 mg total) by mouth 2 (two) times daily., Disp: 60 tablet, Rfl: 0 .  timolol (TIMOPTIC) 0.5 % ophthalmic solution, Place 1 drop into the left eye daily., Disp: , Rfl:   Past Medical History: Past Medical History:  Diagnosis Date  . Cancer ([place])    renal cell 2 years ago and bladder cancer 2004   . Chronic kidney disease    right kidney removed 2 years ago renal cell  . Heart murmur   . History of bladder cancer   . Hypothyroidism     Tobacco Use: Social History   Tobacco Use  Smoking Status Former Smoker  . Packs/day: 2.00  . Years: 30.00  . Pack years: 60.00  . Types: Cigarettes  . Quit date: 12/10/2002  . Years since quitting: 18.2  Smokeless Tobacco Former Systems developer  . Types: Snuff  . Quit date: 01/11/2016    Labs: Recent Review Flowsheet Data    Labs for ITP Cardiac and Pulmonary Rehab Latest Ref Rng & Units 01/28/2021   Cholestrol 0 - 200 mg/dL 164   LDLCALC 0 - 99 mg/dL 109(H)   HDL >40 mg/dL 45   Trlycerides <150 mg/dL 49   Hemoglobin A1c 4.8 - 5.6 % 5.4       Exercise Target  Goals: Exercise Program Goal: Individual exercise prescription set using results from initial 6 min walk test and THRR while considering  patient's activity barriers and safety.   Exercise Prescription Goal: Initial exercise prescription builds to 30-45 minutes a day of aerobic activity, 2-3 days per week.  Home exercise guidelines will be given to patient during program as part of exercise prescription that the participant will acknowledge.   Education: Aerobic Exercise: - Group verbal and visual presentation on the components of exercise prescription. Introduces F.I.T.T principle from ACSM for exercise prescriptions.  Reviews F.I.T.T. principles of aerobic exercise including progression. Written material given at graduation. Flowsheet Row  Cardiac Rehab from 02/28/2021 in [facility] Cardiac and Pulmonary Rehab  Education need identified 02/28/21      Education: Resistance Exercise: - Group verbal and visual presentation on the components of exercise prescription. Introduces F.I.T.T principle from ACSM for exercise prescriptions  Reviews F.I.T.T. principles of resistance exercise including progression. Written material given at graduation.    Education: Exercise & Equipment Safety: - Individual verbal instruction and demonstration of equipment use and safety with use of the equipment. Flowsheet Row Cardiac Rehab from 02/28/2021 in [facility] Cardiac and Pulmonary Rehab  Date 02/02/21  Educator [facility]  Instruction Review Code 1- Verbalizes Understanding      Education: Exercise Physiology & General Exercise Guidelines: - Group verbal and written instruction with models to review the exercise physiology of the cardiovascular system and associated critical values. Provides general exercise guidelines with specific guidelines to those with heart or lung disease.    Education: Flexibility, Balance, Mind/Body Relaxation: - Group verbal and visual presentation with interactive activity on the components of exercise prescription. Introduces F.I.T.T principle from ACSM for exercise prescriptions. Reviews F.I.T.T. principles of flexibility and balance exercise training including progression. Also discusses the mind body connection.  Reviews various relaxation techniques to help reduce and manage stress (i.e. Deep breathing, progressive muscle relaxation, and visualization). Balance handout provided to take home. Written material given at graduation.   Activity Barriers & Risk Stratification:  Activity Barriers & Cardiac Risk Stratification - 02/28/21 0814      Activity Barriers & Cardiac Risk Stratification   Activity Barriers Back Problems;Deconditioning;Muscular Weakness;Balance Concerns;Arthritis    Cardiac Risk Stratification Moderate            6 Minute Walk:  6 Minute Walk    Row Name 02/28/21 0813         6 Minute Walk   Phase Initial     Distance 1750 feet     Walk Time 6 minutes     # of Rest Breaks 0     MPH 3.31     METS 4.06     RPE 7     VO2 Peak 14.19     Symptoms No     Resting HR 78 bpm     Resting BP 128/64     Max Ex. HR 110 bpm     Max Ex. BP 132/64            Oxygen Initial Assessment:   Oxygen Re-Evaluation:   Oxygen Discharge (Final Oxygen Re-Evaluation):   Initial Exercise Prescription:  Initial Exercise Prescription - 02/28/21 0800      Date of Initial Exercise RX and Referring Provider   Date 02/28/21    Referring Provider Isaias Cowman MD      Treadmill   MPH 3    Grade 2    Minutes 15    METs 4.12  Recumbant Elliptical   Level 2    RPM 50    Minutes 15    METs 3      REL-XR   Level 3    Speed 50    Minutes 15    METs 3      Prescription Details   Frequency (times per week) 3    Duration Progress to 30 minutes of continuous aerobic without signs/symptoms of physical distress      Intensity   THRR 40-80% of Max Heartrate 110-143    Ratings of Perceived Exertion 11-13    Perceived Dyspnea 0-4      Progression   Progression Continue to progress workloads to maintain intensity without signs/symptoms of physical distress.      Resistance Training   Training Prescription Yes    Weight 5 lb    Reps 10-15           Perform Capillary Blood Glucose checks as needed.  Exercise Prescription Changes:  Exercise Prescription Changes    Row Name 02/28/21 0800             Response to Exercise   Blood Pressure (Admit) 128/64       Blood Pressure (Exercise) 132/64       Heart Rate (Admit) 78 bpm       Heart Rate (Exercise) 110 bpm       Rating of Perceived Exertion (Exercise) 7       Symptoms none       Comments walk test results              Exercise Comments:  Exercise Comments    Row Name 02/28/21 0725           Exercise  Comments First full day of exercise!  Patient was oriented to gym and equipment including functions, settings, policies, and procedures.  Patient's individual exercise prescription and treatment plan were reviewed.  All starting workloads were established based on the results of the 6 minute walk test done at initial orientation visit.  The plan for exercise progression was also introduced and progression will be customized based on patient's performance and goals.              Exercise Goals and Review:  Exercise Goals    Row Name 02/28/21 0816             Exercise Goals   Increase Physical Activity Yes       Intervention Provide advice, education, support and counseling about physical activity/exercise needs.;Develop an individualized exercise prescription for aerobic and resistive training based on initial evaluation findings, risk stratification, comorbidities and participant's personal goals.       Expected Outcomes Short Term: Attend rehab on a regular basis to increase amount of physical activity.;Long Term: Add in home exercise to make exercise part of routine and to increase amount of physical activity.;Long Term: Exercising regularly at least 3-5 days a week.       Increase Strength and Stamina Yes       Intervention Provide advice, education, support and counseling about physical activity/exercise needs.;Develop an individualized exercise prescription for aerobic and resistive training based on initial evaluation findings, risk stratification, comorbidities and participant's personal goals.       Expected Outcomes Short Term: Increase workloads from initial exercise prescription for resistance, speed, and METs.;Short Term: Perform resistance training exercises routinely during rehab and add in resistance training at home;Long Term: Improve cardiorespiratory fitness, muscular endurance and strength as measured  by increased METs and functional capacity (6MWT)       Able to understand  and use rate of perceived exertion (RPE) scale Yes       Intervention Provide education and explanation on how to use RPE scale       Expected Outcomes Short Term: Able to use RPE daily in rehab to express subjective intensity level;Long Term:  Able to use RPE to guide intensity level when exercising independently       Able to understand and use Dyspnea scale Yes       Intervention Provide education and explanation on how to use Dyspnea scale       Expected Outcomes Long Term: Able to use Dyspnea scale to guide intensity level when exercising independently;Short Term: Able to use Dyspnea scale daily in rehab to express subjective sense of shortness of breath during exertion       Knowledge and understanding of Target Heart Rate Range (THRR) Yes       Intervention Provide education and explanation of THRR including how the numbers were predicted and where they are located for reference       Expected Outcomes Short Term: Able to state/look up THRR;Long Term: Able to use THRR to govern intensity when exercising independently;Short Term: Able to use daily as guideline for intensity in rehab       Able to check pulse independently Yes       Intervention Provide education and demonstration on how to check pulse in carotid and radial arteries.;Review the importance of being able to check your own pulse for safety during independent exercise       Expected Outcomes Short Term: Able to explain why pulse checking is important during independent exercise;Long Term: Able to check pulse independently and accurately       Understanding of Exercise Prescription Yes       Intervention Provide education, explanation, and written materials on patient's individual exercise prescription       Expected Outcomes Short Term: Able to explain program exercise prescription;Long Term: Able to explain home exercise prescription to exercise independently              Exercise Goals Re-Evaluation :  Exercise Goals  Re-Evaluation    Row Name 02/28/21 0725             Exercise Goal Re-Evaluation   Exercise Goals Review Increase Physical Activity;Able to understand and use rate of perceived exertion (RPE) scale;Knowledge and understanding of Target Heart Rate Range (THRR);Understanding of Exercise Prescription;Increase Strength and Stamina;Able to understand and use Dyspnea scale;Able to check pulse independently       Comments Reviewed RPE and dyspnea scales, THR and program prescription with pt today.  Pt voiced understanding and was given a copy of goals to take home.       Expected Outcomes Short: Use RPE daily to regulate intensity. Long: Follow program prescription in THR.              Discharge Exercise Prescription (Final Exercise Prescription Changes):  Exercise Prescription Changes - 02/28/21 0800      Response to Exercise   Blood Pressure (Admit) 128/64    Blood Pressure (Exercise) 132/64    Heart Rate (Admit) 78 bpm    Heart Rate (Exercise) 110 bpm    Rating of Perceived Exertion (Exercise) 7    Symptoms none    Comments walk test results           Nutrition:  Target Goals:  Understanding of nutrition guidelines, daily intake of sodium 1500mg , cholesterol 200mg , calories 30% from fat and 7% or less from saturated fats, daily to have 5 or more servings of fruits and vegetables.  Education: All About Nutrition: -Group instruction provided by verbal, written material, interactive activities, discussions, models, and posters to present general guidelines for heart healthy nutrition including fat, fiber, MyPlate, the role of sodium in heart healthy nutrition, utilization of the nutrition label, and utilization of this knowledge for meal planning. Follow up email sent as well. Written material given at graduation.   Biometrics:  Pre Biometrics - 02/28/21 0817      Pre Biometrics   Height 6' 2.75" (1.899 m)    Weight 255 lb 9.6 oz (115.9 kg)    BMI (Calculated) 32.15    Single  Leg Stand 11.1 seconds            Nutrition Therapy Plan and Nutrition Goals:   Nutrition Assessments:  MEDIFICTS Score Key:  ?70 Need to make dietary changes   40-70 Heart Healthy Diet  ? 40 Therapeutic Level Cholesterol Diet  Flowsheet Row Cardiac Rehab from 02/28/2021 in [facility] Cardiac and Pulmonary Rehab  Picture Your Plate Total Score on Admission 73     Picture Your Plate Scores:  <88 Unhealthy dietary pattern with much room for improvement.  41-50 Dietary pattern unlikely to meet recommendations for good health and room for improvement.  51-60 More healthful dietary pattern, with some room for improvement.   >60 Healthy dietary pattern, although there may be some specific behaviors that could be improved.    Nutrition Goals Re-Evaluation:   Nutrition Goals Discharge (Final Nutrition Goals Re-Evaluation):   Psychosocial: Target Goals: Acknowledge presence or absence of significant depression and/or stress, maximize coping skills, provide positive support system. Participant is able to verbalize types and ability to use techniques and skills needed for reducing stress and depression.   Education: Stress, Anxiety, and Depression - Group verbal and visual presentation to define topics covered.  Reviews how body is impacted by stress, anxiety, and depression.  Also discusses healthy ways to reduce stress and to treat/manage anxiety and depression.  Written material given at graduation.   Education: Sleep Hygiene -Provides group verbal and written instruction about how sleep can affect your health.  Define sleep hygiene, discuss sleep cycles and impact of sleep habits. Review good sleep hygiene tips.    Initial Review & Psychosocial Screening:  Initial Psych Review & Screening - 02/02/21 0909      Initial Review   Current issues with Current Stress Concerns    Comments He works for himself and states that his wife thinks he is stressed from his job.       Family Dynamics   Good Support System? Yes    Comments He can look to his wife, good friends and brother In-law for support.      Barriers   Psychosocial barriers to participate in program The patient should benefit from training in stress management and relaxation.;There are no identifiable barriers or psychosocial needs.      Screening Interventions   Interventions To provide support and resources with identified psychosocial needs;Encouraged to exercise;Provide feedback about the scores to participant    Expected Outcomes Short Term goal: Utilizing psychosocial counselor, staff and physician to assist with identification of specific Stressors or current issues interfering with healing process. Setting desired goal for each stressor or current issue identified.;Long Term Goal: Stressors or current issues are controlled or eliminated.;Short Term  goal: Identification and review with participant of any Quality of Life or Depression concerns found by scoring the questionnaire.;Long Term goal: The participant improves quality of Life and PHQ9 Scores as seen by post scores and/or verbalization of changes           Quality of Life Scores:   Quality of Life - 02/28/21 0817      Quality of Life   Select Quality of Life      Quality of Life Scores   Health/Function Pre 29.1 %    Socioeconomic Pre 30 %    Psych/Spiritual Pre 30 %    Family Pre 30 %    GLOBAL Pre 29.6 %          Scores of 19 and below usually indicate a poorer quality of life in these areas.  A difference of  2-3 points is a clinically meaningful difference.  A difference of 2-3 points in the total score of the Quality of Life Index has been associated with significant improvement in overall quality of life, self-image, physical symptoms, and general health in studies assessing change in quality of life.  PHQ-9: Recent Review Flowsheet Data    Depression screen [facility] 2/9 02/28/2021   Decreased Interest 0   Down, Depressed,  Hopeless 0   PHQ - 2 Score 0   Altered sleeping 0   Tired, decreased energy 1   Change in appetite 0   Feeling bad or failure about yourself  0   Trouble concentrating 0   Moving slowly or fidgety/restless 0   Suicidal thoughts 0   PHQ-9 Score 1   Difficult doing work/chores Not difficult at all     Interpretation of Total Score  Total Score Depression Severity:  1-4 = Minimal depression, 5-9 = Mild depression, 10-14 = Moderate depression, 15-19 = Moderately severe depression, 20-27 = Severe depression   Psychosocial Evaluation and Intervention:  Psychosocial Evaluation - 02/02/21 0911      Psychosocial Evaluation & Interventions   Interventions Encouraged to exercise with the program and follow exercise prescription;Relaxation education;Stress management education    Comments He can look to his wife, good friends and brother In-law for support.He can look to his wife, good friends and brother In-law for support.    Expected Outcomes Short: Exercise regularly to support mental health and notify staff of any changes. Long: maintain mental health and well being through teaching of rehab or prescribed medications independently.           Psychosocial Re-Evaluation:   Psychosocial Discharge (Final Psychosocial Re-Evaluation):   Vocational Rehabilitation: Provide vocational rehab assistance to qualifying candidates.   Vocational Rehab Evaluation & Intervention:   Education: Education Goals: Education classes will be provided on a variety of topics geared toward better understanding of heart health and risk factor modification. Participant will state understanding/return demonstration of topics presented as noted by education test scores.  Learning Barriers/Preferences:  Learning Barriers/Preferences - 02/02/21 0909      Learning Barriers/Preferences   Learning Barriers None    Learning Preferences None           General Cardiac Education Topics:  AED/CPR: - Group  verbal and written instruction with the use of models to demonstrate the basic use of the AED with the basic ABC's of resuscitation.   Anatomy and Cardiac Procedures: - Group verbal and visual presentation and models provide information about basic cardiac anatomy and function. Reviews the testing methods done to diagnose heart disease and the outcomes  of the test results. Describes the treatment choices: Medical Management, Angioplasty, or Coronary Bypass Surgery for treating various heart conditions including Myocardial Infarction, Angina, Valve Disease, and Cardiac Arrhythmias.  Written material given at graduation. Flowsheet Row Cardiac Rehab from 02/28/2021 in [facility] Cardiac and Pulmonary Rehab  Education need identified 02/28/21      Medication Safety: - Group verbal and visual instruction to review commonly prescribed medications for heart and lung disease. Reviews the medication, class of the drug, and side effects. Includes the steps to properly store meds and maintain the prescription regimen.  Written material given at graduation.   Intimacy: - Group verbal instruction through game format to discuss how heart and lung disease can affect sexual intimacy. Written material given at graduation..   Know Your Numbers and Heart Failure: - Group verbal and visual instruction to discuss disease risk factors for cardiac and pulmonary disease and treatment options.  Reviews associated critical values for Overweight/Obesity, Hypertension, Cholesterol, and Diabetes.  Discusses basics of heart failure: signs/symptoms and treatments.  Introduces Heart Failure Zone chart for action plan for heart failure.  Written material given at graduation.   Infection Prevention: - Provides verbal and written material to individual with discussion of infection control including proper hand washing and proper equipment cleaning during exercise session. Flowsheet Row Cardiac Rehab from 02/28/2021 in [facility] Cardiac and  Pulmonary Rehab  Date 02/02/21  Educator [facility]  Instruction Review Code 1- Verbalizes Understanding      Falls Prevention: - Provides verbal and written material to individual with discussion of falls prevention and safety. Flowsheet Row Cardiac Rehab from 02/28/2021 in [facility] Cardiac and Pulmonary Rehab  Date 02/02/21  Educator [facility]  Instruction Review Code 1- Verbalizes Understanding      Other: -Provides group and verbal instruction on various topics (see comments)   Knowledge Questionnaire Score:  Knowledge Questionnaire Score - 02/28/21 0818      Knowledge Questionnaire Score   Pre Score 24/26 Education Focus: angina, exercise           Core Components/Risk Factors/Patient Goals at Admission:  Personal Goals and Risk Factors at Admission - 02/28/21 0819      Core Components/Risk Factors/Patient Goals on Admission    Weight Management Yes;Weight Loss;Obesity    Intervention Weight Management: Develop a combined nutrition and exercise program designed to reach desired caloric intake, while maintaining appropriate intake of nutrient and fiber, sodium and fats, and appropriate energy expenditure required for the weight goal.;Weight Management: Provide education and appropriate resources to help participant work on and attain dietary goals.;Weight Management/Obesity: Establish reasonable short term and long term weight goals.;Obesity: Provide education and appropriate resources to help participant work on and attain dietary goals.    Admit Weight 255 lb 9.6 oz (115.9 kg)    Goal Weight: Short Term 250 lb (113.4 kg)    Goal Weight: Long Term 245 lb (111.1 kg)    Expected Outcomes Short Term: Continue to assess and modify interventions until short term weight is achieved;Long Term: Adherence to nutrition and physical activity/exercise program aimed toward attainment of established weight goal;Weight Loss: Understanding of general recommendations for a balanced deficit meal plan, which  promotes 1-2 lb weight loss per week and includes a negative energy balance of [id number] kcal/d;Understanding recommendations for meals to include 15-35% energy as protein, 25-35% energy from fat, 35-60% energy from carbohydrates, less than 200mg  of dietary cholesterol, 20-35 gm of total fiber daily;Understanding of distribution of calorie intake throughout the day with the consumption of  4-5 meals/snacks    Hypertension Yes    Intervention Monitor prescription use compliance.;Provide education on lifestyle modifcations including regular physical activity/exercise, weight management, moderate sodium restriction and increased consumption of fresh fruit, vegetables, and low fat dairy, alcohol moderation, and smoking cessation.    Expected Outcomes Short Term: Continued assessment and intervention until BP is < 140/83mm HG in hypertensive participants. < 130/85mm HG in hypertensive participants with diabetes, heart failure or chronic kidney disease.;Long Term: Maintenance of blood pressure at goal levels.    Lipids Yes    Intervention Provide education and support for participant on nutrition & aerobic/resistive exercise along with prescribed medications to achieve LDL 70mg , HDL >40mg .    Expected Outcomes Short Term: Participant states understanding of desired cholesterol values and is compliant with medications prescribed. Participant is following exercise prescription and nutrition guidelines.;Long Term: Cholesterol controlled with medications as prescribed, with individualized exercise RX and with personalized nutrition plan. Value goals: LDL < 70mg , HDL > 40 mg.           Education:Diabetes - Individual verbal and written instruction to review signs/symptoms of diabetes, desired ranges of glucose level fasting, after meals and with exercise. Acknowledge that pre and post exercise glucose checks will be done for 3 sessions at entry of program.   Core Components/Risk Factors/Patient Goals Review:     Core Components/Risk Factors/Patient Goals at Discharge (Final Review):    ITP Comments:  ITP Comments    Row Name 02/02/21 0913 02/06/21 0904 02/28/21 0724 02/28/21 0812 03/01/21 1012   ITP Comments Virtual Visit completed. Patient informed on EP and RD appointment and 6 Minute walk test. Patient also informed of patient health questionnaires on My Chart. Patient Verbalizes understanding. Visit diagnosis can be found in [facility]. Pt had been having some issues with low blood pressure over the weekend.  He has a follow up appt on Wednesday with Dr. Saralyn Pilar.  He did not want to do the program and was very hot, sweaty on arrival.  He did not do his walk test today and will see what Dr. Saralyn Pilar says about program.  We will hold his paperwork for the week. First full day of exercise!  Patient was oriented to gym and equipment including functions, settings, policies, and procedures.  Patient's individual exercise prescription and treatment plan were reviewed.  All starting workloads were established based on the results of the 6 minute walk test done at initial orientation visit.  The plan for exercise progression was also introduced and progression will be customized based on patient's performance and goals. Javone came in for first day and walk test today.Completed 6MWT and gym orientation. Initial ITP created and sent for review to Dr. Emily Filbert, Medical Director. 30 Day review completed. Medical Director ITP review done, changes made as directed, and signed approval by Medical Director.  New to program          Comments:

## 2021-03-02 ENCOUNTER — Other Ambulatory Visit: Payer: Self-pay

## 2021-03-02 DIAGNOSIS — I214 Non-ST elevation (NSTEMI) myocardial infarction: Secondary | ICD-10-CM | POA: Diagnosis not present

## 2021-03-02 NOTE — Progress Notes (Signed)
Daily Session Note  Patient Details  Name: TROYCE GIESKE MRN: [id number] Date of Birth: [birth year] Referring Provider:   Flowsheet Row Cardiac Rehab from 02/28/2021 in [facility]  Referring Provider Isaias Cowman MD      Encounter Date: 03/02/2021  Check In:  Session Check In - 03/02/21 0729      Check-In   Supervising physician immediately available to respond to emergencies See telemetry face sheet for immediately available ER MD    Location [facility]-Cardiac & Pulmonary Rehab    Staff Present Birdie Sons, MPA, Mauricia Area, BS, ACSM CEP, Exercise Physiologist;Amanda Oletta Darter, BA, ACSM CEP, Exercise Physiologist    Virtual Visit No    Medication changes reported     No    Fall or balance concerns reported    No    Warm-up and Cool-down Performed on first and last piece of equipment    Resistance Training Performed Yes    VAD Patient? No    PAD/SET Patient? No      Pain Assessment   Currently in Pain? No/denies              Social History   Tobacco Use  Smoking Status Former Smoker  . Packs/day: 2.00  . Years: 30.00  . Pack years: 60.00  . Types: Cigarettes  . Quit date: 12/10/2002  . Years since quitting: 18.2  Smokeless Tobacco Former Systems developer  . Types: Snuff  . Quit date: 01/11/2016    Goals Met:  Independence with exercise equipment Exercise tolerated well No report of cardiac concerns or symptoms Strength training completed today  Goals Unmet:  Not Applicable  Comments: Pt able to follow exercise prescription today without complaint.  Will continue to monitor for progression.  Reviewed home exercise with pt today.  Pt plans to walk, use variety of gym equipment for exercise.  Reviewed THR, pulse, RPE, sign and symptoms, pulse oximetery and when to call 911 or MD.  Also discussed weather considerations and indoor options.  Pt voiced understanding.  Dr. Emily Filbert is Medical Director for [facility].

## 2021-03-07 ENCOUNTER — Other Ambulatory Visit: Payer: Self-pay | Admitting: Family Medicine

## 2021-03-07 ENCOUNTER — Other Ambulatory Visit (HOSPITAL_COMMUNITY): Payer: Self-pay | Admitting: Family Medicine

## 2021-03-07 DIAGNOSIS — Z85528 Personal history of other malignant neoplasm of kidney: Secondary | ICD-10-CM

## 2021-03-07 DIAGNOSIS — R109 Unspecified abdominal pain: Secondary | ICD-10-CM

## 2021-03-10 ENCOUNTER — Telehealth (HOSPITAL_COMMUNITY): Payer: Self-pay

## 2021-03-14 ENCOUNTER — Encounter: Payer: BC Managed Care – PPO | Attending: Cardiology

## 2021-03-14 ENCOUNTER — Encounter: Payer: Self-pay | Admitting: *Deleted

## 2021-03-14 DIAGNOSIS — I214 Non-ST elevation (NSTEMI) myocardial infarction: Secondary | ICD-10-CM | POA: Insufficient documentation

## 2021-03-21 ENCOUNTER — Ambulatory Visit: Admission: RE | Admit: 2021-03-21 | Payer: BC Managed Care – PPO | Source: Ambulatory Visit

## 2021-03-29 ENCOUNTER — Encounter: Payer: Self-pay | Admitting: *Deleted

## 2021-03-29 ENCOUNTER — Telehealth: Payer: Self-pay

## 2021-03-29 DIAGNOSIS — I214 Non-ST elevation (NSTEMI) myocardial infarction: Secondary | ICD-10-CM

## 2021-03-29 NOTE — Telephone Encounter (Signed)
Called as patient has not attended rehab since his first day 03/02/21. LM.

## 2021-03-29 NOTE — Progress Notes (Signed)
Cardiac Individual Treatment Plan  Patient Details  Name: Marc [name] MRN: [id number] Date of Birth: July 17, 1961 Referring Provider:   Flowsheet Row Cardiac Rehab from 02/28/2021 in [facility] Cardiac and Pulmonary Rehab  Referring Provider Isaias Cowman MD      Initial Encounter Date:  Flowsheet Row Cardiac Rehab from 02/28/2021 in [facility] Cardiac and Pulmonary Rehab  Date 02/28/21      Visit Diagnosis: NSTEMI (non-ST elevated myocardial infarction) [facility])  Patient's Home Medications on Admission:  Current Outpatient Medications:  .  acetaminophen (TYLENOL) 500 MG tablet, Take 1,000 mg by mouth every morning., Disp: , Rfl:  .  atorvastatin (LIPITOR) 80 MG tablet, Take 1 tablet (80 mg total) by mouth daily., Disp: 30 tablet, Rfl: 0 .  diphenhydramine-acetaminophen (TYLENOL PM) 25-500 MG TABS tablet, Take 2 tablets by mouth at bedtime as needed., Disp: , Rfl:  .  levothyroxine (SYNTHROID) 200 MCG tablet, Take 200 mcg by mouth daily before breakfast. , Disp: , Rfl:  .  levothyroxine (SYNTHROID) 200 MCG tablet, TAKE 1 TABLET(200 MCG) BY MOUTH EVERY DAY 30 TO 60 MINUTES BEFORE BREAKFAST ON AN EMPTY STOMACH AND WITH A GLASS OF WATER, Disp: , Rfl:  .  lisinopril (ZESTRIL) 10 MG tablet, Take 1 tablet (10 mg total) by mouth daily., Disp: 30 tablet, Rfl: 0 .  metoprolol tartrate (LOPRESSOR) 25 MG tablet, Take 1 tablet (25 mg total) by mouth 2 (two) times daily., Disp: 60 tablet, Rfl: 0 .  nitroGLYCERIN (NITROSTAT) 0.4 MG SL tablet, Place 1 tablet (0.4 mg total) under the tongue every 5 (five) minutes x 3 doses as needed for chest pain., Disp: 30 tablet, Rfl: 0 .  omeprazole (PRILOSEC) 40 MG capsule, Take 40 mg by mouth daily. (Patient not taking: Reported on 02/02/2021), Disp: , Rfl:  .  sucralfate (CARAFATE) 1 g tablet, Take 1 tablet (1 g total) by mouth 4 (four) times daily -  with meals and at bedtime for 14 days. (Patient not taking: Reported on 02/02/2021), Disp: 56 tablet, Rfl: 0 .  ticagrelor  (BRILINTA) 90 MG TABS tablet, Take 1 tablet (90 mg total) by mouth 2 (two) times daily., Disp: 60 tablet, Rfl: 0 .  timolol (TIMOPTIC) 0.5 % ophthalmic solution, Place 1 drop into the left eye daily., Disp: , Rfl:   Past Medical History: Past Medical History:  Diagnosis Date  . Cancer ([place])    renal cell 2 years ago and bladder cancer 2004   . Chronic kidney disease    right kidney removed 2 years ago renal cell  . Heart murmur   . History of bladder cancer   . Hypothyroidism     Tobacco Use: Social History   Tobacco Use  Smoking Status Former Smoker  . Packs/day: 2.00  . Years: 30.00  . Pack years: 60.00  . Types: Cigarettes  . Quit date: 12/10/2002  . Years since quitting: 18.3  Smokeless Tobacco Former Systems developer  . Types: Snuff  . Quit date: 01/11/2016    Labs: Recent Review Flowsheet Data    Labs for ITP Cardiac and Pulmonary Rehab Latest Ref Rng & Units 01/28/2021   Cholestrol 0 - 200 mg/dL 164   LDLCALC 0 - 99 mg/dL 109(H)   HDL >40 mg/dL 45   Trlycerides <150 mg/dL 49   Hemoglobin A1c 4.8 - 5.6 % 5.4       Exercise Target Goals: Exercise Program Goal: Individual exercise prescription set using results from initial 6 min walk test and THRR while considering  patient's activity barriers and safety.   Exercise Prescription Goal: Initial exercise prescription builds to 30-45 minutes a day of aerobic activity, 2-3 days per week.  Home exercise guidelines will be given to patient during program as part of exercise prescription that the participant will acknowledge.   Education: Aerobic Exercise: - Group verbal and visual presentation on the components of exercise prescription. Introduces F.I.T.T principle from ACSM for exercise prescriptions.  Reviews F.I.T.T. principles of aerobic exercise including progression. Written material given at graduation. Flowsheet Row Cardiac Rehab from 03/02/2021 in [facility] Cardiac and Pulmonary Rehab  Education need identified 02/28/21       Education: Resistance Exercise: - Group verbal and visual presentation on the components of exercise prescription. Introduces F.I.T.T principle from ACSM for exercise prescriptions  Reviews F.I.T.T. principles of resistance exercise including progression. Written material given at graduation.    Education: Exercise & Equipment Safety: - Individual verbal instruction and demonstration of equipment use and safety with use of the equipment. Flowsheet Row Cardiac Rehab from 03/02/2021 in [facility] Cardiac and Pulmonary Rehab  Date 02/02/21  Educator [facility]  Instruction Review Code 1- Verbalizes Understanding      Education: Exercise Physiology & General Exercise Guidelines: - Group verbal and written instruction with models to review the exercise physiology of the cardiovascular system and associated critical values. Provides general exercise guidelines with specific guidelines to those with heart or lung disease.    Education: Flexibility, Balance, Mind/Body Relaxation: - Group verbal and visual presentation with interactive activity on the components of exercise prescription. Introduces F.I.T.T principle from ACSM for exercise prescriptions. Reviews F.I.T.T. principles of flexibility and balance exercise training including progression. Also discusses the mind body connection.  Reviews various relaxation techniques to help reduce and manage stress (i.e. Deep breathing, progressive muscle relaxation, and visualization). Balance handout provided to take home. Written material given at graduation.   Activity Barriers & Risk Stratification:  Activity Barriers & Cardiac Risk Stratification - 02/28/21 0814      Activity Barriers & Cardiac Risk Stratification   Activity Barriers Back Problems;Deconditioning;Muscular Weakness;Balance Concerns;Arthritis    Cardiac Risk Stratification Moderate           6 Minute Walk:  6 Minute Walk    Row Name 02/28/21 0813         6 Minute Walk   Phase  Initial     Distance 1750 feet     Walk Time 6 minutes     # of Rest Breaks 0     MPH 3.31     METS 4.06     RPE 7     VO2 Peak 14.19     Symptoms No     Resting HR 78 bpm     Resting BP 128/64     Max Ex. HR 110 bpm     Max Ex. BP 132/64            Oxygen Initial Assessment:   Oxygen Re-Evaluation:   Oxygen Discharge (Final Oxygen Re-Evaluation):   Initial Exercise Prescription:  Initial Exercise Prescription - 02/28/21 0800      Date of Initial Exercise RX and Referring Provider   Date 02/28/21    Referring Provider Paraschos, Alexander MD      Treadmill   MPH 3    Grade 2    Minutes 15    METs 4.12      Recumbant Elliptical   Level 2    RPM 50    Minutes 15  METs 3      REL-XR   Level 3    Speed 50    Minutes 15    METs 3      Prescription Details   Frequency (times per week) 3    Duration Progress to 30 minutes of continuous aerobic without signs/symptoms of physical distress      Intensity   THRR 40-80% of Max Heartrate 110-143    Ratings of Perceived Exertion 11-13    Perceived Dyspnea 0-4      Progression   Progression Continue to progress workloads to maintain intensity without signs/symptoms of physical distress.      Resistance Training   Training Prescription Yes    Weight 5 lb    Reps 10-15           Perform Capillary Blood Glucose checks as needed.  Exercise Prescription Changes:  Exercise Prescription Changes    Row Name 02/28/21 0800 03/02/21 0700 03/14/21 1600         Response to Exercise   Blood Pressure (Admit) 128/64 -- 106/64     Blood Pressure (Exercise) 132/64 -- 142/82     Blood Pressure (Exit) -- -- 110/60     Heart Rate (Admit) 78 bpm -- 86 bpm     Heart Rate (Exercise) 110 bpm -- 116 bpm     Heart Rate (Exit) -- -- 81 bpm     Rating of Perceived Exertion (Exercise) 7 -- 13     Symptoms none -- none     Comments walk test results -- second full day of exercise     Duration -- -- Continue with 30 min  of aerobic exercise without signs/symptoms of physical distress.     Intensity -- -- THRR unchanged           Progression   Progression -- -- Continue to progress workloads to maintain intensity without signs/symptoms of physical distress.     Average METs -- -- 4.24           Resistance Training   Training Prescription -- -- Yes     Weight -- -- 5 lb     Reps -- -- 10-15           Interval Training   Interval Training -- -- No           Treadmill   MPH -- -- 3.3     Grade -- -- 2     Minutes -- -- 15     METs -- -- 4.44           Recumbant Elliptical   Level -- -- 1     Minutes -- -- 15     METs -- -- 4.6           REL-XR   Level -- -- 3     Minutes -- -- 15     METs -- -- 3.7           Home Exercise Plan   Plans to continue exercise at -- Home (comment)  Walk, use variety of gym equipments Home (comment)  Walk, use variety of gym equipments     Frequency -- Add 2 additional days to program exercise sessions. Add 2 additional days to program exercise sessions.     Initial Home Exercises Provided -- 03/02/21 03/02/21            Exercise Comments:  Exercise Comments    Row Name 02/28/21 [phone]  Exercise Comments First full day of exercise!  Patient was oriented to gym and equipment including functions, settings, policies, and procedures.  Patient's individual exercise prescription and treatment plan were reviewed.  All starting workloads were established based on the results of the 6 minute walk test done at initial orientation visit.  The plan for exercise progression was also introduced and progression will be customized based on patient's performance and goals.              Exercise Goals and Review:  Exercise Goals    Row Name 02/28/21 0816             Exercise Goals   Increase Physical Activity Yes       Intervention Provide advice, education, support and counseling about physical activity/exercise needs.;Develop an individualized  exercise prescription for aerobic and resistive training based on initial evaluation findings, risk stratification, comorbidities and participant's personal goals.       Expected Outcomes Short Term: Attend rehab on a regular basis to increase amount of physical activity.;Long Term: Add in home exercise to make exercise part of routine and to increase amount of physical activity.;Long Term: Exercising regularly at least 3-5 days a week.       Increase Strength and Stamina Yes       Intervention Provide advice, education, support and counseling about physical activity/exercise needs.;Develop an individualized exercise prescription for aerobic and resistive training based on initial evaluation findings, risk stratification, comorbidities and participant's personal goals.       Expected Outcomes Short Term: Increase workloads from initial exercise prescription for resistance, speed, and METs.;Short Term: Perform resistance training exercises routinely during rehab and add in resistance training at home;Long Term: Improve cardiorespiratory fitness, muscular endurance and strength as measured by increased METs and functional capacity (6MWT)       Able to understand and use rate of perceived exertion (RPE) scale Yes       Intervention Provide education and explanation on how to use RPE scale       Expected Outcomes Short Term: Able to use RPE daily in rehab to express subjective intensity level;Long Term:  Able to use RPE to guide intensity level when exercising independently       Able to understand and use Dyspnea scale Yes       Intervention Provide education and explanation on how to use Dyspnea scale       Expected Outcomes Long Term: Able to use Dyspnea scale to guide intensity level when exercising independently;Short Term: Able to use Dyspnea scale daily in rehab to express subjective sense of shortness of breath during exertion       Knowledge and understanding of Target Heart Rate Range (THRR) Yes        Intervention Provide education and explanation of THRR including how the numbers were predicted and where they are located for reference       Expected Outcomes Short Term: Able to state/look up THRR;Long Term: Able to use THRR to govern intensity when exercising independently;Short Term: Able to use daily as guideline for intensity in rehab       Able to check pulse independently Yes       Intervention Provide education and demonstration on how to check pulse in carotid and radial arteries.;Review the importance of being able to check your own pulse for safety during independent exercise       Expected Outcomes Short Term: Able to explain why pulse checking is important during independent exercise;Long  Term: Able to check pulse independently and accurately       Understanding of Exercise Prescription Yes       Intervention Provide education, explanation, and written materials on patient's individual exercise prescription       Expected Outcomes Short Term: Able to explain program exercise prescription;Long Term: Able to explain home exercise prescription to exercise independently              Exercise Goals Re-Evaluation :  Exercise Goals Re-Evaluation    Row Name 02/28/21 0725 03/02/21 0755 03/14/21 1618         Exercise Goal Re-Evaluation   Exercise Goals Review Increase Physical Activity;Able to understand and use rate of perceived exertion (RPE) scale;Knowledge and understanding of Target Heart Rate Range (THRR);Understanding of Exercise Prescription;Increase Strength and Stamina;Able to understand and use Dyspnea scale;Able to check pulse independently Increase Physical Activity;Able to understand and use rate of perceived exertion (RPE) scale;Knowledge and understanding of Target Heart Rate Range (THRR) Increase Physical Activity;Increase Strength and Stamina;Understanding of Exercise Prescription     Comments Reviewed RPE and dyspnea scales, THR and program prescription with pt today.   Pt voiced understanding and was given a copy of goals to take home. Reviewed home exercise with pt today.  Pt plans to walk, use variety of gym equipment for exercise.  Reviewed THR, pulse, RPE, sign and symptoms, pulse oximetery and when to call 911 or MD.  Also discussed weather considerations and indoor options.  Pt voiced understanding. Only attended two full days of exercise thus far.     Expected Outcomes Short: Use RPE daily to regulate intensity. Long: Follow program prescription in THR. Short: Monitor heart rate  Long: Continue to exercise at home Short: Regular attendance Long: Continue to improve stamina            Discharge Exercise Prescription (Final Exercise Prescription Changes):  Exercise Prescription Changes - 03/14/21 1600      Response to Exercise   Blood Pressure (Admit) 106/64    Blood Pressure (Exercise) 142/82    Blood Pressure (Exit) 110/60    Heart Rate (Admit) 86 bpm    Heart Rate (Exercise) 116 bpm    Heart Rate (Exit) 81 bpm    Rating of Perceived Exertion (Exercise) 13    Symptoms none    Comments second full day of exercise    Duration Continue with 30 min of aerobic exercise without signs/symptoms of physical distress.    Intensity THRR unchanged      Progression   Progression Continue to progress workloads to maintain intensity without signs/symptoms of physical distress.    Average METs 4.24      Resistance Training   Training Prescription Yes    Weight 5 lb    Reps 10-15      Interval Training   Interval Training No      Treadmill   MPH 3.3    Grade 2    Minutes 15    METs 4.44      Recumbant Elliptical   Level 1    Minutes 15    METs 4.6      REL-XR   Level 3    Minutes 15    METs 3.7      Home Exercise Plan   Plans to continue exercise at Home (comment)   Walk, use variety of gym equipments   Frequency Add 2 additional days to program exercise sessions.    Initial Home Exercises Provided 03/02/21  Nutrition:   Target Goals: Understanding of nutrition guidelines, daily intake of sodium 1500mg , cholesterol 200mg , calories 30% from fat and 7% or less from saturated fats, daily to have 5 or more servings of fruits and vegetables.  Education: All About Nutrition: -Group instruction provided by verbal, written material, interactive activities, discussions, models, and posters to present general guidelines for heart healthy nutrition including fat, fiber, MyPlate, the role of sodium in heart healthy nutrition, utilization of the nutrition label, and utilization of this knowledge for meal planning. Follow up email sent as well. Written material given at graduation.   Biometrics:  Pre Biometrics - 02/28/21 0817      Pre Biometrics   Height 6' 2.75" (1.899 m)    Weight 255 lb 9.6 oz (115.9 kg)    BMI (Calculated) 32.15    Single Leg Stand 11.1 seconds            Nutrition Therapy Plan and Nutrition Goals:   Nutrition Assessments:  MEDIFICTS Score Key:  ?70 Need to make dietary changes   40-70 Heart Healthy Diet  ? 40 Therapeutic Level Cholesterol Diet  Flowsheet Row Cardiac Rehab from 02/28/2021 in [facility] Cardiac and Pulmonary Rehab  Picture Your Plate Total Score on Admission 73     Picture Your Plate Scores:  <80 Unhealthy dietary pattern with much room for improvement.  41-50 Dietary pattern unlikely to meet recommendations for good health and room for improvement.  51-60 More healthful dietary pattern, with some room for improvement.   >60 Healthy dietary pattern, although there may be some specific behaviors that could be improved.    Nutrition Goals Re-Evaluation:   Nutrition Goals Discharge (Final Nutrition Goals Re-Evaluation):   Psychosocial: Target Goals: Acknowledge presence or absence of significant depression and/or stress, maximize coping skills, provide positive support system. Participant is able to verbalize types and ability to use techniques and skills  needed for reducing stress and depression.   Education: Stress, Anxiety, and Depression - Group verbal and visual presentation to define topics covered.  Reviews how body is impacted by stress, anxiety, and depression.  Also discusses healthy ways to reduce stress and to treat/manage anxiety and depression.  Written material given at graduation.   Education: Sleep Hygiene -Provides group verbal and written instruction about how sleep can affect your health.  Define sleep hygiene, discuss sleep cycles and impact of sleep habits. Review good sleep hygiene tips.    Initial Review & Psychosocial Screening:  Initial Psych Review & Screening - 02/02/21 0909      Initial Review   Current issues with Current Stress Concerns    Comments He works for himself and states that his wife thinks he is stressed from his job.      Family Dynamics   Good Support System? Yes    Comments He can look to his wife, good friends and brother In-law for support.      Barriers   Psychosocial barriers to participate in program The patient should benefit from training in stress management and relaxation.;There are no identifiable barriers or psychosocial needs.      Screening Interventions   Interventions To provide support and resources with identified psychosocial needs;Encouraged to exercise;Provide feedback about the scores to participant    Expected Outcomes Short Term goal: Utilizing psychosocial counselor, staff and physician to assist with identification of specific Stressors or current issues interfering with healing process. Setting desired goal for each stressor or current issue identified.;Long Term Goal: Stressors or current issues are  controlled or eliminated.;Short Term goal: Identification and review with participant of any Quality of Life or Depression concerns found by scoring the questionnaire.;Long Term goal: The participant improves quality of Life and PHQ9 Scores as seen by post scores and/or  verbalization of changes           Quality of Life Scores:   Quality of Life - 02/28/21 0817      Quality of Life   Select Quality of Life      Quality of Life Scores   Health/Function Pre 29.1 %    Socioeconomic Pre 30 %    Psych/Spiritual Pre 30 %    Family Pre 30 %    GLOBAL Pre 29.6 %          Scores of 19 and below usually indicate a poorer quality of life in these areas.  A difference of  2-3 points is a clinically meaningful difference.  A difference of 2-3 points in the total score of the Quality of Life Index has been associated with significant improvement in overall quality of life, self-image, physical symptoms, and general health in studies assessing change in quality of life.  PHQ-9: Recent Review Flowsheet Data    Depression screen [facility] 2/9 02/28/2021   Decreased Interest 0   Down, Depressed, Hopeless 0   PHQ - 2 Score 0   Altered sleeping 0   Tired, decreased energy 1   Change in appetite 0   Feeling bad or failure about yourself  0   Trouble concentrating 0   Moving slowly or fidgety/restless 0   Suicidal thoughts 0   PHQ-9 Score 1   Difficult doing work/chores Not difficult at all     Interpretation of Total Score  Total Score Depression Severity:  1-4 = Minimal depression, 5-9 = Mild depression, 10-14 = Moderate depression, 15-19 = Moderately severe depression, 20-27 = Severe depression   Psychosocial Evaluation and Intervention:  Psychosocial Evaluation - 02/02/21 0911      Psychosocial Evaluation & Interventions   Interventions Encouraged to exercise with the program and follow exercise prescription;Relaxation education;Stress management education    Comments He can look to his wife, good friends and brother In-law for support.He can look to his wife, good friends and brother In-law for support.    Expected Outcomes Short: Exercise regularly to support mental health and notify staff of any changes. Long: maintain mental health and well being  through teaching of rehab or prescribed medications independently.           Psychosocial Re-Evaluation:   Psychosocial Discharge (Final Psychosocial Re-Evaluation):   Vocational Rehabilitation: Provide vocational rehab assistance to qualifying candidates.   Vocational Rehab Evaluation & Intervention:   Education: Education Goals: Education classes will be provided on a variety of topics geared toward better understanding of heart health and risk factor modification. Participant will state understanding/return demonstration of topics presented as noted by education test scores.  Learning Barriers/Preferences:  Learning Barriers/Preferences - 02/02/21 0909      Learning Barriers/Preferences   Learning Barriers None    Learning Preferences None           General Cardiac Education Topics:  AED/CPR: - Group verbal and written instruction with the use of models to demonstrate the basic use of the AED with the basic ABC's of resuscitation.   Anatomy and Cardiac Procedures: - Group verbal and visual presentation and models provide information about basic cardiac anatomy and function. Reviews the testing methods done to diagnose heart  disease and the outcomes of the test results. Describes the treatment choices: Medical Management, Angioplasty, or Coronary Bypass Surgery for treating various heart conditions including Myocardial Infarction, Angina, Valve Disease, and Cardiac Arrhythmias.  Written material given at graduation. Flowsheet Row Cardiac Rehab from 03/02/2021 in [facility] Cardiac and Pulmonary Rehab  Education need identified 02/28/21      Medication Safety: - Group verbal and visual instruction to review commonly prescribed medications for heart and lung disease. Reviews the medication, class of the drug, and side effects. Includes the steps to properly store meds and maintain the prescription regimen.  Written material given at graduation.   Intimacy: - Group verbal  instruction through game format to discuss how heart and lung disease can affect sexual intimacy. Written material given at graduation..   Know Your Numbers and Heart Failure: - Group verbal and visual instruction to discuss disease risk factors for cardiac and pulmonary disease and treatment options.  Reviews associated critical values for Overweight/Obesity, Hypertension, Cholesterol, and Diabetes.  Discusses basics of heart failure: signs/symptoms and treatments.  Introduces Heart Failure Zone chart for action plan for heart failure.  Written material given at graduation.   Infection Prevention: - Provides verbal and written material to individual with discussion of infection control including proper hand washing and proper equipment cleaning during exercise session. Flowsheet Row Cardiac Rehab from 03/02/2021 in [facility] Cardiac and Pulmonary Rehab  Date 02/02/21  Educator [facility]  Instruction Review Code 1- Verbalizes Understanding      Falls Prevention: - Provides verbal and written material to individual with discussion of falls prevention and safety. Flowsheet Row Cardiac Rehab from 03/02/2021 in [facility] Cardiac [facility] Pulmonary [facility]  Date 02/02/21  Educator [facility]  Instruction Review Code 1- Verbalizes Understanding      Other: -Provides group and verbal instruction on various topics (see comments)   Knowledge Questionnaire Score:  Knowledge Questionnaire Score - 02/28/21 0818      Knowledge Questionnaire Score   Pre Score 24/26 Education Focus: angina, exercise           Core Components/Risk Factors/Patient Goals at Admission:  Personal Goals and Risk Factors at Admission - 02/28/21 0819      Core Components/Risk Factors/Patient Goals on Admission    Weight Management Yes;Weight Loss;Obesity    Intervention Weight Management: Develop a combined nutrition and exercise program designed to reach desired caloric intake, while maintaining appropriate intake of nutrient and fiber, sodium  and fats, and appropriate energy expenditure required for the weight goal.;Weight Management: Provide education and appropriate resources to help participant work on and attain dietary goals.;Weight Management/Obesity: Establish reasonable short term and long term weight goals.;Obesity: Provide education and appropriate resources to help participant work on and attain dietary goals.    Admit Weight 255 lb 9.6 oz (115.9 kg)    Goal Weight: Short Term 250 lb (113.4 kg)    Goal Weight: Long Term 245 lb (111.1 kg)    Expected Outcomes Short Term: Continue to assess and modify interventions until short term weight is achieved;Long Term: Adherence to nutrition and physical activity/exercise program aimed toward attainment of established weight goal;Weight Loss: Understanding of general recommendations for a balanced deficit meal plan, which promotes 1-2 lb weight loss per week and includes a negative energy balance of [phone] kcal/d;Understanding recommendations for meals to include 15-35% energy as protein, 25-35% energy from fat, 35-60% energy from carbohydrates, less than 200mg  of dietary cholesterol, 20-35 gm of total fiber daily;Understanding of distribution of calorie intake throughout the day  with the consumption of 4-5 meals/snacks    Hypertension Yes    Intervention Monitor prescription use compliance.;Provide education on lifestyle modifcations including regular physical activity/exercise, weight management, moderate sodium restriction and increased consumption of fresh fruit, vegetables, and low fat dairy, alcohol moderation, and smoking cessation.    Expected Outcomes Short Term: Continued assessment and intervention until BP is < 140/43mm HG in hypertensive participants. < 130/49mm HG in hypertensive participants with diabetes, heart failure or chronic kidney disease.;Long Term: Maintenance of blood pressure at goal levels.    Lipids Yes    Intervention Provide education and support for participant  on nutrition & aerobic/resistive exercise along with prescribed medications to achieve LDL 70mg , HDL >40mg .    Expected Outcomes Short Term: Participant states understanding of desired cholesterol values and is compliant with medications prescribed. Participant is following exercise prescription and nutrition guidelines.;Long Term: Cholesterol controlled with medications as prescribed, with individualized exercise RX and with personalized nutrition plan. Value goals: LDL < 70mg , HDL > 40 mg.           Education:Diabetes - Individual verbal and written instruction to review signs/symptoms of diabetes, desired ranges of glucose level fasting, after meals and with exercise. Acknowledge that pre and post exercise glucose checks will be done for 3 sessions at entry of program.   Core Components/Risk Factors/Patient Goals Review:    Core Components/Risk Factors/Patient Goals at Discharge (Final Review):    ITP Comments:  ITP Comments    Row Name 02/02/21 0913 02/06/21 0904 02/28/21 0724 02/28/21 0812 03/01/21 1012   ITP Comments Virtual Visit completed. Patient informed on EP and RD appointment and 6 Minute walk test. Patient also informed of patient health questionnaires on My Chart. Patient Verbalizes understanding. Visit diagnosis can be found in [facility] 01/28/[facility]. Pt had been having some issues with low blood pressure over the weekend.  He has a follow up appt on Wednesday with Dr. Saralyn Pilar.  He did not want to do the program and was very hot, sweaty on arrival.  He did not do his walk test today and will see what Dr. Saralyn Pilar says about program.  We will hold his paperwork for the week. First full day of exercise!  Patient was oriented to gym and equipment including functions, settings, policies, and procedures.  Patient's individual exercise prescription and treatment plan were reviewed.  All starting workloads were established based on the results of the 6 minute walk test done at initial  orientation visit.  The plan for exercise progression was also introduced and progression will be customized based on patient's performance and goals. Ashon came in for first day and walk test today.Completed 6MWT and gym orientation. Initial ITP created and sent for review to Dr. Emily Filbert, Medical Director. 30 Day review completed. Medical Director ITP review done, changes made as directed, and signed approval by Medical Director.  New to program   Row Name 03/14/21 1618 03/15/21 1445 03/29/21 0624       ITP Comments Only attended first day.  No show no call since then Left message 30 Day review completed. Medical Director ITP review done, changes made as directed, and signed approval by Medical Director.No visits in April            Comments:

## 2021-04-06 DIAGNOSIS — I214 Non-ST elevation (NSTEMI) myocardial infarction: Secondary | ICD-10-CM

## 2021-04-06 NOTE — Progress Notes (Signed)
Cardiac Individual Treatment Plan  Patient Details  Name: Marc [name] MRN: [id number] Date of Birth: July 17, 1961 Referring Provider:   Flowsheet Row Cardiac Rehab from 02/28/2021 in [facility] Cardiac and Pulmonary Rehab  Referring Provider Isaias Cowman MD      Initial Encounter Date:  Flowsheet Row Cardiac Rehab from 02/28/2021 in [facility] Cardiac and Pulmonary Rehab  Date 02/28/21      Visit Diagnosis: NSTEMI (non-ST elevated myocardial infarction) [facility])  Patient's Home Medications on Admission:  Current Outpatient Medications:  .  acetaminophen (TYLENOL) 500 MG tablet, Take 1,000 mg by mouth every morning., Disp: , Rfl:  .  atorvastatin (LIPITOR) 80 MG tablet, Take 1 tablet (80 mg total) by mouth daily., Disp: 30 tablet, Rfl: 0 .  diphenhydramine-acetaminophen (TYLENOL PM) 25-500 MG TABS tablet, Take 2 tablets by mouth at bedtime as needed., Disp: , Rfl:  .  levothyroxine (SYNTHROID) 200 MCG tablet, Take 200 mcg by mouth daily before breakfast. , Disp: , Rfl:  .  levothyroxine (SYNTHROID) 200 MCG tablet, TAKE 1 TABLET(200 MCG) BY MOUTH EVERY DAY 30 TO 60 MINUTES BEFORE BREAKFAST ON AN EMPTY STOMACH AND WITH A GLASS OF WATER, Disp: , Rfl:  .  lisinopril (ZESTRIL) 10 MG tablet, Take 1 tablet (10 mg total) by mouth daily., Disp: 30 tablet, Rfl: 0 .  metoprolol tartrate (LOPRESSOR) 25 MG tablet, Take 1 tablet (25 mg total) by mouth 2 (two) times daily., Disp: 60 tablet, Rfl: 0 .  nitroGLYCERIN (NITROSTAT) 0.4 MG SL tablet, Place 1 tablet (0.4 mg total) under the tongue every 5 (five) minutes x 3 doses as needed for chest pain., Disp: 30 tablet, Rfl: 0 .  omeprazole (PRILOSEC) 40 MG capsule, Take 40 mg by mouth daily. (Patient not taking: Reported on 02/02/2021), Disp: , Rfl:  .  sucralfate (CARAFATE) 1 g tablet, Take 1 tablet (1 g total) by mouth 4 (four) times daily -  with meals and at bedtime for 14 days. (Patient not taking: Reported on 02/02/2021), Disp: 56 tablet, Rfl: 0 .  ticagrelor  (BRILINTA) 90 MG TABS tablet, Take 1 tablet (90 mg total) by mouth 2 (two) times daily., Disp: 60 tablet, Rfl: 0 .  timolol (TIMOPTIC) 0.5 % ophthalmic solution, Place 1 drop into the left eye daily., Disp: , Rfl:   Past Medical History: Past Medical History:  Diagnosis Date  . Cancer ([place])    renal cell 2 years ago and bladder cancer 2004   . Chronic kidney disease    right kidney removed 2 years ago renal cell  . Heart murmur   . History of bladder cancer   . Hypothyroidism     Tobacco Use: Social History   Tobacco Use  Smoking Status Former Smoker  . Packs/day: 2.00  . Years: 30.00  . Pack years: 60.00  . Types: Cigarettes  . Quit date: 12/10/2002  . Years since quitting: 18.3  Smokeless Tobacco Former Systems developer  . Types: Snuff  . Quit date: 01/11/2016    Labs: Recent Review Flowsheet Data    Labs for ITP Cardiac and Pulmonary Rehab Latest Ref Rng & Units 01/28/2021   Cholestrol 0 - 200 mg/dL 164   LDLCALC 0 - 99 mg/dL 109(H)   HDL >40 mg/dL 45   Trlycerides <150 mg/dL 49   Hemoglobin A1c 4.8 - 5.6 % 5.4       Exercise Target Goals: Exercise Program Goal: Individual exercise prescription set using results from initial 6 min walk test and THRR while considering  patient's activity barriers and safety.   Exercise Prescription Goal: Initial exercise prescription builds to 30-45 minutes a day of aerobic activity, 2-3 days per week.  Home exercise guidelines will be given to patient during program as part of exercise prescription that the participant will acknowledge.   Education: Aerobic Exercise: - Group verbal and visual presentation on the components of exercise prescription. Introduces F.I.T.T principle from ACSM for exercise prescriptions.  Reviews F.I.T.T. principles of aerobic exercise including progression. Written material given at graduation. Flowsheet Row Cardiac Rehab from 03/02/2021 in [facility] Cardiac and Pulmonary Rehab  Education need identified 02/28/21       Education: Resistance Exercise: - Group verbal and visual presentation on the components of exercise prescription. Introduces F.I.T.T principle from ACSM for exercise prescriptions  Reviews F.I.T.T. principles of resistance exercise including progression. Written material given at graduation.    Education: Exercise & Equipment Safety: - Individual verbal instruction and demonstration of equipment use and safety with use of the equipment. Flowsheet Row Cardiac Rehab from 03/02/2021 in [facility] Cardiac and Pulmonary Rehab  Date 02/02/21  Educator [facility]  Instruction Review Code 1- Verbalizes Understanding      Education: Exercise Physiology & General Exercise Guidelines: - Group verbal and written instruction with models to review the exercise physiology of the cardiovascular system and associated critical values. Provides general exercise guidelines with specific guidelines to those with heart or lung disease.    Education: Flexibility, Balance, Mind/Body Relaxation: - Group verbal and visual presentation with interactive activity on the components of exercise prescription. Introduces F.I.T.T principle from ACSM for exercise prescriptions. Reviews F.I.T.T. principles of flexibility and balance exercise training including progression. Also discusses the mind body connection.  Reviews various relaxation techniques to help reduce and manage stress (i.e. Deep breathing, progressive muscle relaxation, and visualization). Balance handout provided to take home. Written material given at graduation.   Activity Barriers & Risk Stratification:  Activity Barriers & Cardiac Risk Stratification - 02/28/21 0814      Activity Barriers & Cardiac Risk Stratification   Activity Barriers Back Problems;Deconditioning;Muscular Weakness;Balance Concerns;Arthritis    Cardiac Risk Stratification Moderate           6 Minute Walk:  6 Minute Walk    Row Name 02/28/21 0813         6 Minute Walk   Phase  Initial     Distance 1750 feet     Walk Time 6 minutes     # of Rest Breaks 0     MPH 3.31     METS 4.06     RPE 7     VO2 Peak 14.19     Symptoms No     Resting HR 78 bpm     Resting BP 128/64     Max Ex. HR 110 bpm     Max Ex. BP 132/64            Oxygen Initial Assessment:   Oxygen Re-Evaluation:   Oxygen Discharge (Final Oxygen Re-Evaluation):   Initial Exercise Prescription:  Initial Exercise Prescription - 02/28/21 0800      Date of Initial Exercise RX and Referring Provider   Date 02/28/21    Referring Provider Paraschos, Alexander MD      Treadmill   MPH 3    Grade 2    Minutes 15    METs 4.12      Recumbant Elliptical   Level 2    RPM 50    Minutes 15  METs 3      REL-XR   Level 3    Speed 50    Minutes 15    METs 3      Prescription Details   Frequency (times per week) 3    Duration Progress to 30 minutes of continuous aerobic without signs/symptoms of physical distress      Intensity   THRR 40-80% of Max Heartrate 110-143    Ratings of Perceived Exertion 11-13    Perceived Dyspnea 0-4      Progression   Progression Continue to progress workloads to maintain intensity without signs/symptoms of physical distress.      Resistance Training   Training Prescription Yes    Weight 5 lb    Reps 10-15           Perform Capillary Blood Glucose checks as needed.  Exercise Prescription Changes:  Exercise Prescription Changes    Row Name 02/28/21 0800 03/02/21 0700 03/14/21 1600         Response to Exercise   Blood Pressure (Admit) 128/64 -- 106/64     Blood Pressure (Exercise) 132/64 -- 142/82     Blood Pressure (Exit) -- -- 110/60     Heart Rate (Admit) 78 bpm -- 86 bpm     Heart Rate (Exercise) 110 bpm -- 116 bpm     Heart Rate (Exit) -- -- 81 bpm     Rating of Perceived Exertion (Exercise) 7 -- 13     Symptoms none -- none     Comments walk test results -- second full day of exercise     Duration -- -- Continue with 30 min  of aerobic exercise without signs/symptoms of physical distress.     Intensity -- -- THRR unchanged           Progression   Progression -- -- Continue to progress workloads to maintain intensity without signs/symptoms of physical distress.     Average METs -- -- 4.24           Resistance Training   Training Prescription -- -- Yes     Weight -- -- 5 lb     Reps -- -- 10-15           Interval Training   Interval Training -- -- No           Treadmill   MPH -- -- 3.3     Grade -- -- 2     Minutes -- -- 15     METs -- -- 4.44           Recumbant Elliptical   Level -- -- 1     Minutes -- -- 15     METs -- -- 4.6           REL-XR   Level -- -- 3     Minutes -- -- 15     METs -- -- 3.7           Home Exercise Plan   Plans to continue exercise at -- Home (comment)  Walk, use variety of gym equipments Home (comment)  Walk, use variety of gym equipments     Frequency -- Add 2 additional days to program exercise sessions. Add 2 additional days to program exercise sessions.     Initial Home Exercises Provided -- 03/02/21 03/02/21            Exercise Comments:  Exercise Comments    Row Name 02/28/21 [phone]  Exercise Comments First full day of exercise!  Patient was oriented to gym and equipment including functions, settings, policies, and procedures.  Patient's individual exercise prescription and treatment plan were reviewed.  All starting workloads were established based on the results of the 6 minute walk test done at initial orientation visit.  The plan for exercise progression was also introduced and progression will be customized based on patient's performance and goals.              Exercise Goals and Review:  Exercise Goals    Row Name 02/28/21 0816             Exercise Goals   Increase Physical Activity Yes       Intervention Provide advice, education, support and counseling about physical activity/exercise needs.;Develop an individualized  exercise prescription for aerobic and resistive training based on initial evaluation findings, risk stratification, comorbidities and participant's personal goals.       Expected Outcomes Short Term: Attend rehab on a regular basis to increase amount of physical activity.;Long Term: Add in home exercise to make exercise part of routine and to increase amount of physical activity.;Long Term: Exercising regularly at least 3-5 days a week.       Increase Strength and Stamina Yes       Intervention Provide advice, education, support and counseling about physical activity/exercise needs.;Develop an individualized exercise prescription for aerobic and resistive training based on initial evaluation findings, risk stratification, comorbidities and participant's personal goals.       Expected Outcomes Short Term: Increase workloads from initial exercise prescription for resistance, speed, and METs.;Short Term: Perform resistance training exercises routinely during rehab and add in resistance training at home;Long Term: Improve cardiorespiratory fitness, muscular endurance and strength as measured by increased METs and functional capacity (6MWT)       Able to understand and use rate of perceived exertion (RPE) scale Yes       Intervention Provide education and explanation on how to use RPE scale       Expected Outcomes Short Term: Able to use RPE daily in rehab to express subjective intensity level;Long Term:  Able to use RPE to guide intensity level when exercising independently       Able to understand and use Dyspnea scale Yes       Intervention Provide education and explanation on how to use Dyspnea scale       Expected Outcomes Long Term: Able to use Dyspnea scale to guide intensity level when exercising independently;Short Term: Able to use Dyspnea scale daily in rehab to express subjective sense of shortness of breath during exertion       Knowledge and understanding of Target Heart Rate Range (THRR) Yes        Intervention Provide education and explanation of THRR including how the numbers were predicted and where they are located for reference       Expected Outcomes Short Term: Able to state/look up THRR;Long Term: Able to use THRR to govern intensity when exercising independently;Short Term: Able to use daily as guideline for intensity in rehab       Able to check pulse independently Yes       Intervention Provide education and demonstration on how to check pulse in carotid and radial arteries.;Review the importance of being able to check your own pulse for safety during independent exercise       Expected Outcomes Short Term: Able to explain why pulse checking is important during independent exercise;Long  Term: Able to check pulse independently and accurately       Understanding of Exercise Prescription Yes       Intervention Provide education, explanation, and written materials on patient's individual exercise prescription       Expected Outcomes Short Term: Able to explain program exercise prescription;Long Term: Able to explain home exercise prescription to exercise independently              Exercise Goals Re-Evaluation :  Exercise Goals Re-Evaluation    Row Name 02/28/21 0725 03/02/21 0755 03/14/21 1618         Exercise Goal Re-Evaluation   Exercise Goals Review Increase Physical Activity;Able to understand and use rate of perceived exertion (RPE) scale;Knowledge and understanding of Target Heart Rate Range (THRR);Understanding of Exercise Prescription;Increase Strength and Stamina;Able to understand and use Dyspnea scale;Able to check pulse independently Increase Physical Activity;Able to understand and use rate of perceived exertion (RPE) scale;Knowledge and understanding of Target Heart Rate Range (THRR) Increase Physical Activity;Increase Strength and Stamina;Understanding of Exercise Prescription     Comments Reviewed RPE and dyspnea scales, THR and program prescription with pt today.   Pt voiced understanding and was given a copy of goals to take home. Reviewed home exercise with pt today.  Pt plans to walk, use variety of gym equipment for exercise.  Reviewed THR, pulse, RPE, sign and symptoms, pulse oximetery and when to call 911 or MD.  Also discussed weather considerations and indoor options.  Pt voiced understanding. Only attended two full days of exercise thus far.     Expected Outcomes Short: Use RPE daily to regulate intensity. Long: Follow program prescription in THR. Short: Monitor heart rate  Long: Continue to exercise at home Short: Regular attendance Long: Continue to improve stamina            Discharge Exercise Prescription (Final Exercise Prescription Changes):  Exercise Prescription Changes - 03/14/21 1600      Response to Exercise   Blood Pressure (Admit) 106/64    Blood Pressure (Exercise) 142/82    Blood Pressure (Exit) 110/60    Heart Rate (Admit) 86 bpm    Heart Rate (Exercise) 116 bpm    Heart Rate (Exit) 81 bpm    Rating of Perceived Exertion (Exercise) 13    Symptoms none    Comments second full day of exercise    Duration Continue with 30 min of aerobic exercise without signs/symptoms of physical distress.    Intensity THRR unchanged      Progression   Progression Continue to progress workloads to maintain intensity without signs/symptoms of physical distress.    Average METs 4.24      Resistance Training   Training Prescription Yes    Weight 5 lb    Reps 10-15      Interval Training   Interval Training No      Treadmill   MPH 3.3    Grade 2    Minutes 15    METs 4.44      Recumbant Elliptical   Level 1    Minutes 15    METs 4.6      REL-XR   Level 3    Minutes 15    METs 3.7      Home Exercise Plan   Plans to continue exercise at Home (comment)   Walk, use variety of gym equipments   Frequency Add 2 additional days to program exercise sessions.    Initial Home Exercises Provided 03/02/21  Nutrition:   Target Goals: Understanding of nutrition guidelines, daily intake of sodium 1500mg , cholesterol 200mg , calories 30% from fat and 7% or less from saturated fats, daily to have 5 or more servings of fruits and vegetables.  Education: All About Nutrition: -Group instruction provided by verbal, written material, interactive activities, discussions, models, and posters to present general guidelines for heart healthy nutrition including fat, fiber, MyPlate, the role of sodium in heart healthy nutrition, utilization of the nutrition label, and utilization of this knowledge for meal planning. Follow up email sent as well. Written material given at graduation.   Biometrics:  Pre Biometrics - 02/28/21 0817      Pre Biometrics   Height 6' 2.75" (1.899 m)    Weight 255 lb 9.6 oz (115.9 kg)    BMI (Calculated) 32.15    Single Leg Stand 11.1 seconds            Nutrition Therapy Plan and Nutrition Goals:   Nutrition Assessments:  MEDIFICTS Score Key:  ?70 Need to make dietary changes   40-70 Heart Healthy Diet  ? 40 Therapeutic Level Cholesterol Diet  Flowsheet Row Cardiac Rehab from 02/28/2021 in [facility] Cardiac and Pulmonary Rehab  Picture Your Plate Total Score on Admission 73     Picture Your Plate Scores:  <80 Unhealthy dietary pattern with much room for improvement.  41-50 Dietary pattern unlikely to meet recommendations for good health and room for improvement.  51-60 More healthful dietary pattern, with some room for improvement.   >60 Healthy dietary pattern, although there may be some specific behaviors that could be improved.    Nutrition Goals Re-Evaluation:   Nutrition Goals Discharge (Final Nutrition Goals Re-Evaluation):   Psychosocial: Target Goals: Acknowledge presence or absence of significant depression and/or stress, maximize coping skills, provide positive support system. Participant is able to verbalize types and ability to use techniques and skills  needed for reducing stress and depression.   Education: Stress, Anxiety, and Depression - Group verbal and visual presentation to define topics covered.  Reviews how body is impacted by stress, anxiety, and depression.  Also discusses healthy ways to reduce stress and to treat/manage anxiety and depression.  Written material given at graduation.   Education: Sleep Hygiene -Provides group verbal and written instruction about how sleep can affect your health.  Define sleep hygiene, discuss sleep cycles and impact of sleep habits. Review good sleep hygiene tips.    Initial Review & Psychosocial Screening:  Initial Psych Review & Screening - 02/02/21 0909      Initial Review   Current issues with Current Stress Concerns    Comments He works for himself and states that his wife thinks he is stressed from his job.      Family Dynamics   Good Support System? Yes    Comments He can look to his wife, good friends and brother In-law for support.      Barriers   Psychosocial barriers to participate in program The patient should benefit from training in stress management and relaxation.;There are no identifiable barriers or psychosocial needs.      Screening Interventions   Interventions To provide support and resources with identified psychosocial needs;Encouraged to exercise;Provide feedback about the scores to participant    Expected Outcomes Short Term goal: Utilizing psychosocial counselor, staff and physician to assist with identification of specific Stressors or current issues interfering with healing process. Setting desired goal for each stressor or current issue identified.;Long Term Goal: Stressors or current issues are  controlled or eliminated.;Short Term goal: Identification and review with participant of any Quality of Life or Depression concerns found by scoring the questionnaire.;Long Term goal: The participant improves quality of Life and PHQ9 Scores as seen by post scores and/or  verbalization of changes           Quality of Life Scores:   Quality of Life - 02/28/21 0817      Quality of Life   Select Quality of Life      Quality of Life Scores   Health/Function Pre 29.1 %    Socioeconomic Pre 30 %    Psych/Spiritual Pre 30 %    Family Pre 30 %    GLOBAL Pre 29.6 %          Scores of 19 and below usually indicate a poorer quality of life in these areas.  A difference of  2-3 points is a clinically meaningful difference.  A difference of 2-3 points in the total score of the Quality of Life Index has been associated with significant improvement in overall quality of life, self-image, physical symptoms, and general health in studies assessing change in quality of life.  PHQ-9: Recent Review Flowsheet Data    Depression screen [facility] 2/9 02/28/2021   Decreased Interest 0   Down, Depressed, Hopeless 0   PHQ - 2 Score 0   Altered sleeping 0   Tired, decreased energy 1   Change in appetite 0   Feeling bad or failure about yourself  0   Trouble concentrating 0   Moving slowly or fidgety/restless 0   Suicidal thoughts 0   PHQ-9 Score 1   Difficult doing work/chores Not difficult at all     Interpretation of Total Score  Total Score Depression Severity:  1-4 = Minimal depression, 5-9 = Mild depression, 10-14 = Moderate depression, 15-19 = Moderately severe depression, 20-27 = Severe depression   Psychosocial Evaluation and Intervention:  Psychosocial Evaluation - 02/02/21 0911      Psychosocial Evaluation & Interventions   Interventions Encouraged to exercise with the program and follow exercise prescription;Relaxation education;Stress management education    Comments He can look to his wife, good friends and brother In-law for support.He can look to his wife, good friends and brother In-law for support.    Expected Outcomes Short: Exercise regularly to support mental health and notify staff of any changes. Long: maintain mental health and well being  through teaching of rehab or prescribed medications independently.           Psychosocial Re-Evaluation:   Psychosocial Discharge (Final Psychosocial Re-Evaluation):   Vocational Rehabilitation: Provide vocational rehab assistance to qualifying candidates.   Vocational Rehab Evaluation & Intervention:   Education: Education Goals: Education classes will be provided on a variety of topics geared toward better understanding of heart health and risk factor modification. Participant will state understanding/return demonstration of topics presented as noted by education test scores.  Learning Barriers/Preferences:  Learning Barriers/Preferences - 02/02/21 0909      Learning Barriers/Preferences   Learning Barriers None    Learning Preferences None           General Cardiac Education Topics:  AED/CPR: - Group verbal and written instruction with the use of models to demonstrate the basic use of the AED with the basic ABC's of resuscitation.   Anatomy and Cardiac Procedures: - Group verbal and visual presentation and models provide information about basic cardiac anatomy and function. Reviews the testing methods done to diagnose heart  disease and the outcomes of the test results. Describes the treatment choices: Medical Management, Angioplasty, or Coronary Bypass Surgery for treating various heart conditions including Myocardial Infarction, Angina, Valve Disease, and Cardiac Arrhythmias.  Written material given at graduation. Flowsheet Row Cardiac Rehab from 03/02/2021 in [facility] Cardiac and Pulmonary Rehab  Education need identified 02/28/21      Medication Safety: - Group verbal and visual instruction to review commonly prescribed medications for heart and lung disease. Reviews the medication, class of the drug, and side effects. Includes the steps to properly store meds and maintain the prescription regimen.  Written material given at graduation.   Intimacy: - Group verbal  instruction through game format to discuss how heart and lung disease can affect sexual intimacy. Written material given at graduation..   Know Your Numbers and Heart Failure: - Group verbal and visual instruction to discuss disease risk factors for cardiac and pulmonary disease and treatment options.  Reviews associated critical values for Overweight/Obesity, Hypertension, Cholesterol, and Diabetes.  Discusses basics of heart failure: signs/symptoms and treatments.  Introduces Heart Failure Zone chart for action plan for heart failure.  Written material given at graduation.   Infection Prevention: - Provides verbal and written material to individual with discussion of infection control including proper hand washing and proper equipment cleaning during exercise session. Flowsheet Row Cardiac Rehab from 03/02/2021 in [facility] Cardiac and Pulmonary Rehab  Date 02/02/21  Educator [facility]  Instruction Review Code 1- Verbalizes Understanding      Falls Prevention: - Provides verbal and written material to individual with discussion of falls prevention and safety. Flowsheet Row Cardiac Rehab from 03/02/2021 in [facility] Cardiac and Pulmonary Rehab  Date 02/02/21  Educator [facility]  Instruction Review Code 1- Verbalizes Understanding      Other: -Provides group and verbal instruction on various topics (see comments)   Knowledge Questionnaire Score:  Knowledge Questionnaire Score - 02/28/21 0818      Knowledge Questionnaire Score   Pre Score 24/26 Education Focus: angina, exercise           Core Components/Risk Factors/Patient Goals at Admission:  Personal Goals and Risk Factors at Admission - 02/28/21 0819      Core Components/Risk Factors/Patient Goals on Admission    Weight Management Yes;Weight Loss;Obesity    Intervention Weight Management: Develop a combined nutrition and exercise program designed to reach desired caloric intake, while maintaining appropriate intake of nutrient and fiber, sodium  and fats, and appropriate energy expenditure required for the weight goal.;Weight Management: Provide education and appropriate resources to help participant work on and attain dietary goals.;Weight Management/Obesity: Establish reasonable short term and long term weight goals.;Obesity: Provide education and appropriate resources to help participant work on and attain dietary goals.    Admit Weight 255 lb 9.6 oz (115.9 kg)    Goal Weight: Short Term 250 lb (113.4 kg)    Goal Weight: Long Term 245 lb (111.1 kg)    Expected Outcomes Short Term: Continue to assess and modify interventions until short term weight is achieved;Long Term: Adherence to nutrition and physical activity/exercise program aimed toward attainment of established weight goal;Weight Loss: Understanding of general recommendations for a balanced deficit meal plan, which promotes 1-2 lb weight loss per week and includes a negative energy balance of [phone] kcal/d;Understanding recommendations for meals to include 15-35% energy as protein, 25-35% energy from fat, 35-60% energy from carbohydrates, less than 200mg  of dietary cholesterol, 20-35 gm of total fiber daily;Understanding of distribution of calorie intake throughout the day  with the consumption of 4-5 meals/snacks    Hypertension Yes    Intervention Monitor prescription use compliance.;Provide education on lifestyle modifcations including regular physical activity/exercise, weight management, moderate sodium restriction and increased consumption of fresh fruit, vegetables, and low fat dairy, alcohol moderation, and smoking cessation.    Expected Outcomes Short Term: Continued assessment and intervention until BP is < 140/55mm HG in hypertensive participants. < 130/7mm HG in hypertensive participants with diabetes, heart failure or chronic kidney disease.;Long Term: Maintenance of blood pressure at goal levels.    Lipids Yes    Intervention Provide education and support for participant  on nutrition & aerobic/resistive exercise along with prescribed medications to achieve LDL 70mg , HDL >40mg .    Expected Outcomes Short Term: Participant states understanding of desired cholesterol values and is compliant with medications prescribed. Participant is following exercise prescription and nutrition guidelines.;Long Term: Cholesterol controlled with medications as prescribed, with individualized exercise RX and with personalized nutrition plan. Value goals: LDL < 70mg , HDL > 40 mg.           Education:Diabetes - Individual verbal and written instruction to review signs/symptoms of diabetes, desired ranges of glucose level fasting, after meals and with exercise. Acknowledge that pre and post exercise glucose checks will be done for 3 sessions at entry of program.   Core Components/Risk Factors/Patient Goals Review:    Core Components/Risk Factors/Patient Goals at Discharge (Final Review):    ITP Comments:  ITP Comments    Row Name 02/02/21 0913 02/06/21 0904 02/28/21 0724 02/28/21 0812 03/01/21 1012   ITP Comments Virtual Visit completed. Patient informed on EP and RD appointment and 6 Minute walk test. Patient also informed of patient health questionnaires on My Chart. Patient Verbalizes understanding. Visit diagnosis can be found in [facility] 01/28/2021. Pt had been having some issues with low blood pressure over the weekend.  He has a follow up appt on Wednesday with Dr. Saralyn Pilar.  He did not want to do the program and was very hot, sweaty on arrival.  He did not do his walk test today and will see what Dr. Saralyn Pilar says about program.  We will hold his paperwork for the week. First full day of exercise!  Patient was oriented to gym and equipment including functions, settings, policies, and procedures.  Patient's individual exercise prescription and treatment plan were reviewed.  All starting workloads were established based on the results of the 6 minute walk test done at initial  orientation visit.  The plan for exercise progression was also introduced and progression will be customized based on patient's performance and goals. Trip came in for first day and walk test today.Completed 6MWT and gym orientation. Initial ITP created and sent for review to Dr. Emily Filbert, Medical Director. 30 Day review completed. Medical Director ITP review done, changes made as directed, and signed approval by Medical Director.  New to program   Row Name 03/14/21 1618 03/15/21 1445 03/29/21 0624 03/29/21 1653 04/06/21 1726   ITP Comments Only attended first day.  No show no call since then Left message 30 Day review completed. Medical Director ITP review done, changes made as directed, and signed approval by Medical Director.No visits in April Patient has not called back and has not attended rehab since his first day. Sent letter. Will discharge 04/07/21 if no response. Discharge ITP sent and signed by Dr. Sabra Heck.  Discharge Summary routed to PCP and cardiologist.          Comments: Discharge ITP

## 2021-04-06 NOTE — Progress Notes (Signed)
Discharge Progress Report  Patient Details  Name: Marc [name] MRN: [id number] Date of Birth: 28-Oct-1961 Referring Provider:   Flowsheet Row Cardiac Rehab from 02/28/2021 in [facility] Cardiac and Pulmonary Rehab  Referring Provider Isaias Cowman MD       Number of Visits:   Reason for Discharge:  Early Exit:  Lack of attendance  Smoking History:  Social History   Tobacco Use  Smoking Status Former Smoker  . Packs/day: 2.00  . Years: 30.00  . Pack years: 60.00  . Types: Cigarettes  . Quit date: 12/10/2002  . Years since quitting: 18.3  Smokeless Tobacco Former Systems developer  . Types: Snuff  . Quit date: 01/11/2016    Diagnosis:  NSTEMI (non-ST elevated myocardial infarction) [facility])  ADL UCSD:   Initial Exercise Prescription:  Initial Exercise Prescription - 02/28/21 0800      Date of Initial Exercise RX and Referring Provider   Date 02/28/21    Referring Provider Isaias Cowman MD      Treadmill   MPH 3    Grade 2    Minutes 15    METs 4.12      Recumbant Elliptical   Level 2    RPM 50    Minutes 15    METs 3      REL-XR   Level 3    Speed 50    Minutes 15    METs 3      Prescription Details   Frequency (times per week) 3    Duration Progress to 30 minutes of continuous aerobic without signs/symptoms of physical distress      Intensity   THRR 40-80% of Max Heartrate 110-143    Ratings of Perceived Exertion 11-13    Perceived Dyspnea 0-4      Progression   Progression Continue to progress workloads to maintain intensity without signs/symptoms of physical distress.      Resistance Training   Training Prescription Yes    Weight 5 lb    Reps 10-15           Discharge Exercise Prescription (Final Exercise Prescription Changes):  Exercise Prescription Changes - 03/14/21 1600      Response to Exercise   Blood Pressure (Admit) 106/64    Blood Pressure (Exercise) 142/82    Blood Pressure (Exit) 110/60    Heart Rate (Admit) 86 bpm    Heart  Rate (Exercise) 116 bpm    Heart Rate (Exit) 81 bpm    Rating of Perceived Exertion (Exercise) 13    Symptoms none    Comments second full day of exercise    Duration Continue with 30 min of aerobic exercise without signs/symptoms of physical distress.    Intensity THRR unchanged      Progression   Progression Continue to progress workloads to maintain intensity without signs/symptoms of physical distress.    Average METs 4.24      Resistance Training   Training Prescription Yes    Weight 5 lb    Reps 10-15      Interval Training   Interval Training No      Treadmill   MPH 3.3    Grade 2    Minutes 15    METs 4.44      Recumbant Elliptical   Level 1    Minutes 15    METs 4.6      REL-XR   Level 3    Minutes 15    METs 3.7  Home Exercise Plan   Plans to continue exercise at Home (comment)   Walk, use variety of gym equipments   Frequency Add 2 additional days to program exercise sessions.    Initial Home Exercises Provided 03/02/21           Functional Capacity:  6 Minute Walk    Row Name 02/28/21 0813         6 Minute Walk   Phase Initial     Distance 1750 feet     Walk Time 6 minutes     # of Rest Breaks 0     MPH 3.31     METS 4.06     RPE 7     VO2 Peak 14.19     Symptoms No     Resting HR 78 bpm     Resting BP 128/64     Max Ex. HR 110 bpm     Max Ex. BP 132/64                Nutrition Discharge:   Goals reviewed with patient; copy given to patient.

## 2021-04-11 ENCOUNTER — Other Ambulatory Visit: Payer: Self-pay

## 2021-04-11 ENCOUNTER — Ambulatory Visit
Admission: RE | Admit: 2021-04-11 | Discharge: 2021-04-11 | Disposition: A | Discharge status: Home / Self Care | Payer: BC Managed Care – PPO | Source: Ambulatory Visit | Attending: Family Medicine | Admitting: Family Medicine

## 2021-04-11 DIAGNOSIS — R109 Unspecified abdominal pain: Secondary | ICD-10-CM | POA: Diagnosis not present

## 2021-04-11 DIAGNOSIS — Z85528 Personal history of other malignant neoplasm of kidney: Secondary | ICD-10-CM | POA: Diagnosis present

## 2021-04-11 LAB — POCT I-STAT CREATININE: Creatinine, Ser: 1.2 mg/dL (ref 0.61–1.24)

## 2021-04-11 MED ORDER — IOHEXOL 300 MG/ML  SOLN
100.0000 mL | Freq: Once | INTRAMUSCULAR | Status: AC | PRN
  Administered 2021-04-11: 100 mL via INTRAVENOUS

## 2021-04-20 ENCOUNTER — Other Ambulatory Visit: Payer: Self-pay | Admitting: Family Medicine

## 2021-04-20 DIAGNOSIS — R935 Abnormal findings on diagnostic imaging of other abdominal regions, including retroperitoneum: Secondary | ICD-10-CM

## 2021-04-20 DIAGNOSIS — Z85528 Personal history of other malignant neoplasm of kidney: Secondary | ICD-10-CM

## 2021-04-20 DIAGNOSIS — M899 Disorder of bone, unspecified: Secondary | ICD-10-CM

## 2021-04-20 DIAGNOSIS — N2889 Other specified disorders of kidney and ureter: Secondary | ICD-10-CM

## 2021-05-02 ENCOUNTER — Ambulatory Visit
Admission: RE | Admit: 2021-05-02 | Discharge: 2021-05-02 | Disposition: A | Discharge status: Home / Self Care | Payer: BC Managed Care – PPO | Source: Ambulatory Visit | Attending: Family Medicine | Admitting: Family Medicine

## 2021-05-02 ENCOUNTER — Encounter
Admission: RE | Admit: 2021-05-02 | Discharge: 2021-05-02 | Disposition: A | Discharge status: Home / Self Care | Payer: BC Managed Care – PPO | Source: Ambulatory Visit | Attending: Family Medicine | Admitting: Family Medicine

## 2021-05-02 ENCOUNTER — Other Ambulatory Visit: Payer: Self-pay

## 2021-05-02 DIAGNOSIS — Z85528 Personal history of other malignant neoplasm of kidney: Secondary | ICD-10-CM | POA: Diagnosis present

## 2021-05-02 DIAGNOSIS — M899 Disorder of bone, unspecified: Secondary | ICD-10-CM | POA: Diagnosis present

## 2021-05-02 DIAGNOSIS — N2889 Other specified disorders of kidney and ureter: Secondary | ICD-10-CM | POA: Diagnosis present

## 2021-05-02 DIAGNOSIS — R935 Abnormal findings on diagnostic imaging of other abdominal regions, including retroperitoneum: Secondary | ICD-10-CM | POA: Diagnosis present

## 2021-05-02 MED ORDER — TECHNETIUM TC 99M MEDRONATE IV KIT
20.0000 | PACK | Freq: Once | INTRAVENOUS | Status: AC | PRN
  Administered 2021-05-02: 22.07 via INTRAVENOUS

## 2021-05-02 MED ORDER — GADOBUTROL 1 MMOL/ML IV SOLN
10.0000 mL | Freq: Once | INTRAVENOUS | Status: AC | PRN
  Administered 2021-05-02: 10 mL via INTRAVENOUS

## 2021-05-26 ENCOUNTER — Other Ambulatory Visit: Payer: Self-pay

## 2021-05-26 ENCOUNTER — Encounter: Payer: Self-pay | Admitting: Urology

## 2021-05-26 ENCOUNTER — Ambulatory Visit: Payer: BC Managed Care – PPO | Admitting: Urology

## 2021-05-26 VITALS — BP 124/79 | HR 77 | Ht 62.75 in | Wt 256.0 lb

## 2021-05-26 DIAGNOSIS — Z8551 Personal history of malignant neoplasm of bladder: Secondary | ICD-10-CM | POA: Diagnosis not present

## 2021-05-26 LAB — URINALYSIS, COMPLETE
Bilirubin, UA: NEGATIVE
Glucose, UA: NEGATIVE
Ketones, UA: NEGATIVE
Leukocytes,UA: NEGATIVE
Nitrite, UA: NEGATIVE
Protein,UA: NEGATIVE
RBC, UA: NEGATIVE
Specific Gravity, UA: 1.02 (ref 1.005–1.030)
Urobilinogen, Ur: 0.2 mg/dL (ref 0.2–1.0)
pH, UA: 5.5 (ref 5.0–7.5)

## 2021-05-26 LAB — MICROSCOPIC EXAMINATION
Bacteria, UA: NONE SEEN
Epithelial Cells (non renal): NONE SEEN /hpf (ref 0–10)
RBC, Urine: NONE SEEN /hpf (ref 0–2)

## 2021-05-26 NOTE — Progress Notes (Signed)
   05/26/21  CC:  Chief Complaint  Patient presents with   Cysto   Urologic history: 1.  History urothelial carcinoma bladder             -T1, high-grade urothelial carcinoma 2004             -6 week induction BCG plus maintenance             -Several bladder biopsies for mucosal erythema showed no recurrent tumor or CIS.  Last biopsy 2007   2.  History renal cell carcinoma             -Incidentally noted CTU 01/2014             -Laparoscopic right nephrectomy 02/2014; Dr. Lottie Rater             -pT1b clear cell [place] 2/4; negative margins              3.  History of right hydrocele             -Hydrocelectomy July 2018             -Postop abscess requiring I&D  HPI: Denies gross hematuria or voiding problems.  He was having right-sided abdominal pain and underwent CT ordered by his PCP May 2022 which did show an abnormal soft tissue density in the nephrectomy bed and a 2 cm low-density lesion in the left kidney in addition to a lucency in the right iliac wing.  Further evaluation with abdominal MRI showed no suspicious soft tissue or contrast enhancement in the nephrectomy bed the exophytic lesion in the left kidney showed no enhancement and was a Bosniak 2 cyst.  Bone scan showed no abnormal uptake of radiotracer.  NED. A&Ox3.   No respiratory distress   Abd soft, NT, ND Normal phallus with bilateral descended testicles  Cystoscopy Procedure Note  Patient identification was confirmed, informed consent was obtained, and patient was prepped using Betadine solution.  Lidocaine jelly was administered per urethral meatus.     Pre-Procedure: - Inspection reveals a normal caliber urethral meatus.  Procedure: The flexible cystoscope was introduced without difficulty - No urethral strictures/lesions are present. -  Moderate lateral lobe enlargement  prostate  - Normal bladder neck - Bilateral ureteral orifices identified - Bladder mucosa  reveals no ulcers, tumors, or lesions -  No bladder stones - No trabeculation  Retroflexion shows no tumor/abnormality   Post-Procedure: - Patient tolerated the procedure well  Assessment/ Plan: No evidence recurrent bladder tumor Continue annual surveillance cystoscopy with history high-grade urothelial carcinoma    Abbie Sons, MD

## 2021-11-21 ENCOUNTER — Encounter: Payer: Self-pay | Admitting: Urology

## 2021-12-10 DIAGNOSIS — I214 Non-ST elevation (NSTEMI) myocardial infarction: Secondary | ICD-10-CM

## 2021-12-10 HISTORY — DX: Non-ST elevation (NSTEMI) myocardial infarction: I21.4

## 2022-05-28 ENCOUNTER — Other Ambulatory Visit: Payer: Self-pay | Admitting: Urology

## 2022-05-30 ENCOUNTER — Other Ambulatory Visit: Payer: BC Managed Care – PPO | Admitting: Urology

## 2022-07-11 IMAGING — DX DG CHEST 1V PORT
1 series · 1 of 1 positions shown · non-contrast
Comparison: [DATE] [DATE], [DATE] ([DATE] p.m.)

CLINICAL DATA: Chest pain.

EXAM:
PORTABLE CHEST 1 VIEW

[chest ap]
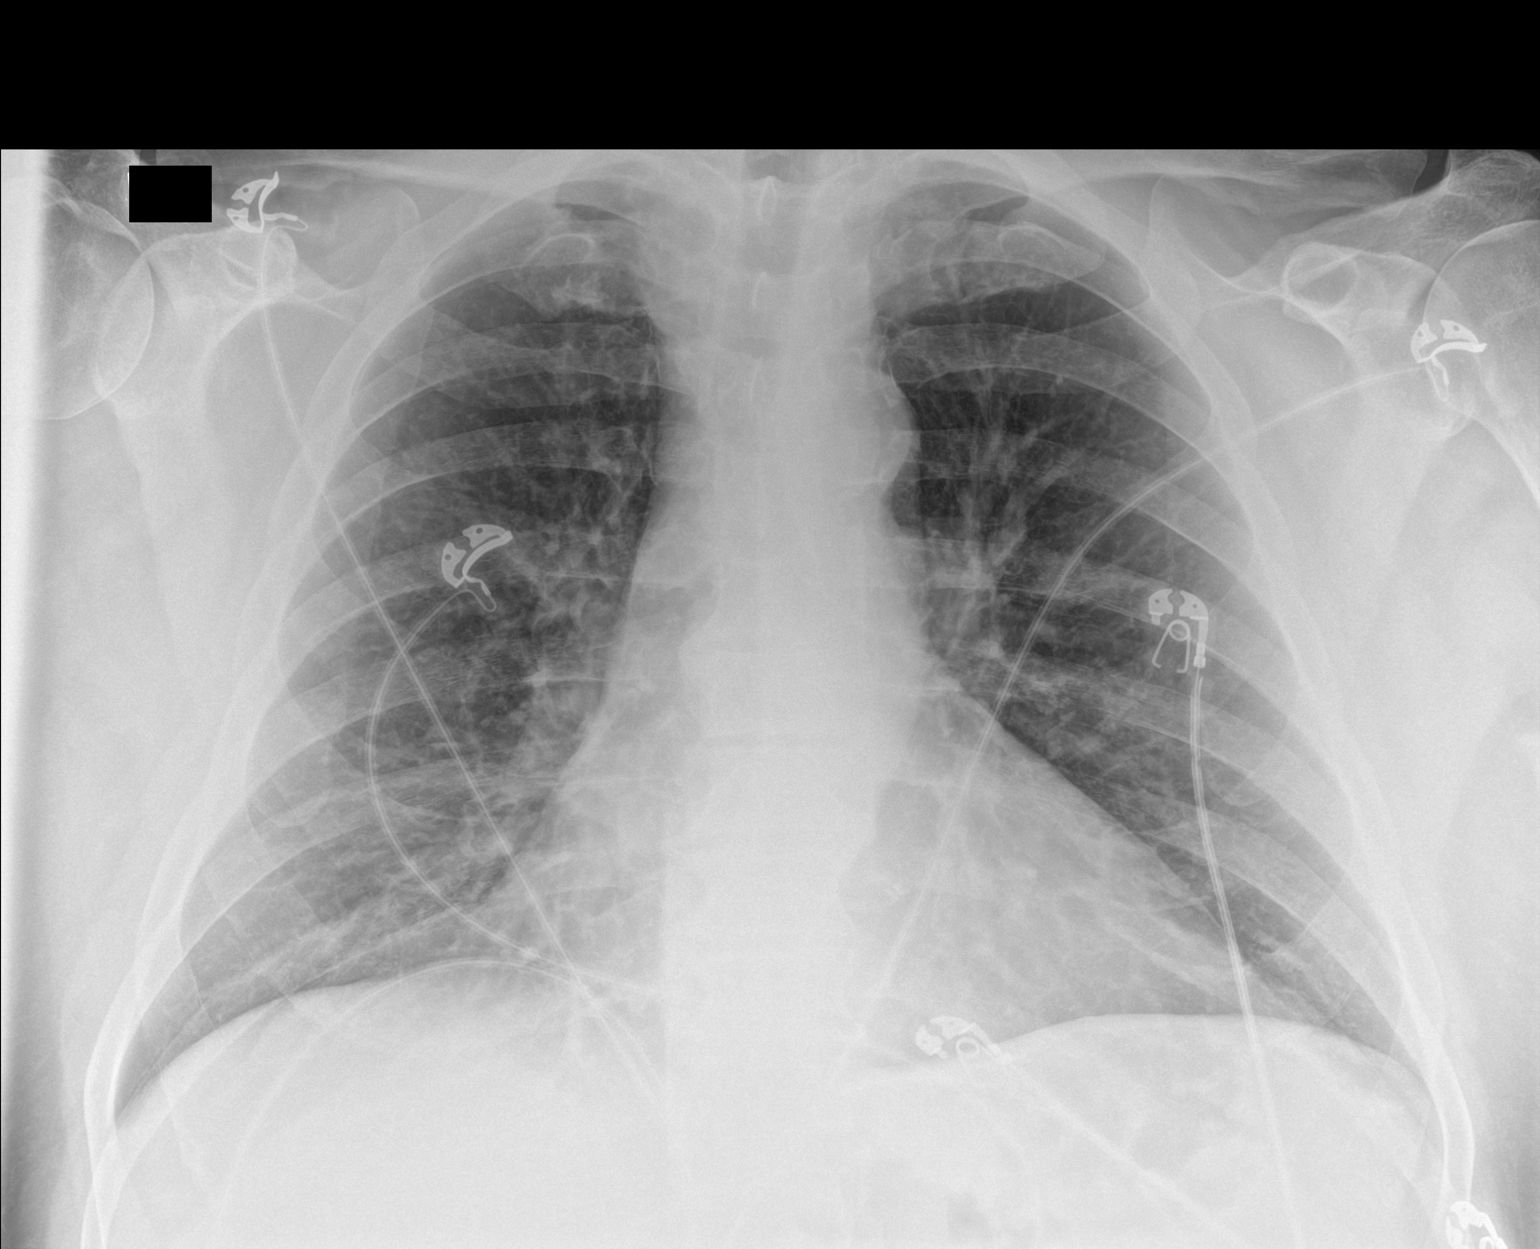

[1 of 1 positions shown; findings below may reference images not displayed]

FINDINGS: Mild, chronic appearing increased lung markings are seen. Very mild
atelectatic changes are noted within the bilateral lung bases, right
slightly greater than left. There is no evidence of a pleural
effusion or pneumothorax. The heart size and mediastinal contours
are within normal limits. The visualized skeletal structures are
unremarkable.
IMPRESSION: Very mild bibasilar atelectatic changes.

## 2022-07-19 ENCOUNTER — Ambulatory Visit: Payer: BC Managed Care – PPO | Admitting: Urology

## 2022-07-19 ENCOUNTER — Encounter: Payer: Self-pay | Admitting: Urology

## 2022-07-19 VITALS — BP 152/94 | HR 90 | Ht 73.0 in | Wt 280.0 lb

## 2022-07-19 DIAGNOSIS — Z8551 Personal history of malignant neoplasm of bladder: Secondary | ICD-10-CM | POA: Diagnosis not present

## 2022-07-19 LAB — URINALYSIS, COMPLETE
Bilirubin, UA: NEGATIVE
Glucose, UA: NEGATIVE
Ketones, UA: NEGATIVE
Leukocytes,UA: NEGATIVE
Nitrite, UA: NEGATIVE
Protein,UA: NEGATIVE
RBC, UA: NEGATIVE
Specific Gravity, UA: 1.025 (ref 1.005–1.030)
Urobilinogen, Ur: 0.2 mg/dL (ref 0.2–1.0)
pH, UA: 5 (ref 5.0–7.5)

## 2022-07-19 LAB — MICROSCOPIC EXAMINATION: Bacteria, UA: NONE SEEN

## 2022-07-19 NOTE — Progress Notes (Signed)
   07/19/22  CC:  Chief Complaint  Patient presents with   Cysto   Urologic history: 1.  History urothelial carcinoma bladder             -T1, high-grade urothelial carcinoma 2004             -6 week induction BCG plus maintenance             -Several bladder biopsies for mucosal erythema showed no recurrent tumor or CIS.  Last biopsy 2007   2.  History renal cell carcinoma             -Incidentally noted CTU 01/2014             -Laparoscopic right nephrectomy 02/2014; Dr. Lottie Rater             -pT1b clear cell [place] 2/4; negative margins              3.  History of right hydrocele             -Hydrocelectomy July 2018             -Postop abscess requiring I&D  HPI: Presents for annual surveillance cystoscopy.  No bothersome LUTS or gross hematuria  See rooming tab for vitals  Cystoscopy Procedure Note  Patient identification was confirmed, informed consent was obtained, and patient was prepped using Betadine solution.  Lidocaine jelly was administered per urethral meatus.     Pre-Procedure: - Inspection reveals a normal caliber urethral meatus.  Procedure: The flexible cystoscope was introduced without difficulty - No urethral strictures/lesions are present. -  Moderate lateral lobe enlargement  prostate  - Normal bladder neck - Bilateral ureteral orifices identified - Bladder mucosa  reveals no ulcers, tumors, or lesions - No bladder stones - No trabeculation  Retroflexion shows no tumor/abnormality   Post-Procedure: - Patient tolerated the procedure well  Assessment/ Plan: No evidence recurrent bladder tumor Continue annual surveillance cystoscopy with history high-grade urothelial carcinoma PSA checked annually by his PCP    Abbie Sons, MD

## 2022-11-22 ENCOUNTER — Other Ambulatory Visit: Payer: Self-pay | Admitting: Family Medicine

## 2022-11-22 DIAGNOSIS — R109 Unspecified abdominal pain: Secondary | ICD-10-CM

## 2022-11-30 ENCOUNTER — Ambulatory Visit
Admission: RE | Admit: 2022-11-30 | Discharge: 2022-11-30 | Disposition: A | Discharge status: Home / Self Care | Payer: BC Managed Care – PPO | Source: Ambulatory Visit | Attending: Family Medicine | Admitting: Family Medicine

## 2022-11-30 DIAGNOSIS — R109 Unspecified abdominal pain: Secondary | ICD-10-CM | POA: Insufficient documentation

## 2023-07-19 ENCOUNTER — Ambulatory Visit: Payer: BC Managed Care – PPO | Admitting: Urology

## 2023-07-19 ENCOUNTER — Encounter: Payer: Self-pay | Admitting: Urology

## 2023-07-19 VITALS — BP 126/85 | HR 73 | Ht 73.0 in | Wt 280.0 lb

## 2023-07-19 DIAGNOSIS — Z8551 Personal history of malignant neoplasm of bladder: Secondary | ICD-10-CM | POA: Diagnosis not present

## 2023-07-19 LAB — MICROSCOPIC EXAMINATION: Bacteria, UA: NONE SEEN

## 2023-07-19 LAB — URINALYSIS, COMPLETE
Bilirubin, UA: NEGATIVE
Glucose, UA: NEGATIVE
Ketones, UA: NEGATIVE
Leukocytes,UA: NEGATIVE
Nitrite, UA: NEGATIVE
Protein,UA: NEGATIVE
RBC, UA: NEGATIVE
Specific Gravity, UA: 1.015 (ref 1.005–1.030)
Urobilinogen, Ur: 0.2 mg/dL (ref 0.2–1.0)
pH, UA: 6 (ref 5.0–7.5)

## 2023-07-19 NOTE — Progress Notes (Signed)
   07/19/23  CC:  Chief Complaint  Patient presents with   Cysto   Urologic history: 1.  History urothelial carcinoma bladder             -T1, high-grade urothelial carcinoma 2004             -6 week induction BCG plus maintenance             -Several bladder biopsies for mucosal erythema showed no recurrent tumor or CIS.  Last biopsy 2007   2.  History renal cell carcinoma             -Incidentally noted CTU 01/2014             -Laparoscopic right nephrectomy 02/2014; Dr. Sinclair Grooms             -pT1b clear cell RCC [place] 2/4; negative margins              3.  History of right hydrocele             -Hydrocelectomy July 2018             -Postop abscess requiring I&D  HPI: Presents for annual surveillance cystoscopy.  No bothersome LUTS or gross hematuria.  UA today with negative dipstick/microscopy  See rooming tab for vitals  Cystoscopy Procedure Note  Patient identification was confirmed, informed consent was obtained, and patient was prepped using Betadine solution.  Lidocaine jelly was administered per urethral meatus.     Pre-Procedure: - Inspection reveals a normal caliber urethral meatus.  Procedure: The flexible cystoscope was introduced without difficulty - No urethral strictures/lesions are present. -  Moderate lateral lobe enlargement  prostate  - Normal bladder neck - Bilateral ureteral orifices identified - Bladder mucosa  reveals no ulcers, tumors, or lesions - No bladder stones - No trabeculation  Retroflexion shows no tumor/abnormality   Post-Procedure: - Patient tolerated the procedure well  Assessment/ Plan: No evidence recurrent bladder tumor Continue annual surveillance cystoscopy with history high-grade urothelial carcinoma PSA checked annually by his PCP    Riki Altes, MD

## 2023-12-20 ENCOUNTER — Emergency Department
Admission: EM | Admit: 2023-12-20 | Discharge: 2023-12-20 | Disposition: A | Discharge status: Home / Self Care | Payer: 59 | Attending: Emergency Medicine | Admitting: Emergency Medicine

## 2023-12-20 ENCOUNTER — Other Ambulatory Visit: Payer: Self-pay

## 2023-12-20 DIAGNOSIS — Z8551 Personal history of malignant neoplasm of bladder: Secondary | ICD-10-CM | POA: Insufficient documentation

## 2023-12-20 DIAGNOSIS — L02512 Cutaneous abscess of left hand: Secondary | ICD-10-CM | POA: Insufficient documentation

## 2023-12-20 DIAGNOSIS — E039 Hypothyroidism, unspecified: Secondary | ICD-10-CM | POA: Diagnosis not present

## 2023-12-20 DIAGNOSIS — L03113 Cellulitis of right upper limb: Secondary | ICD-10-CM | POA: Insufficient documentation

## 2023-12-20 DIAGNOSIS — N189 Chronic kidney disease, unspecified: Secondary | ICD-10-CM | POA: Diagnosis not present

## 2023-12-20 MED ORDER — LIDOCAINE HCL (PF) 1 % IJ SOLN
5.0000 mL | Freq: Once | INTRAMUSCULAR | Status: AC
  Administered 2023-12-20: 5 mL via INTRADERMAL
  Filled 2023-12-20: qty 5

## 2023-12-20 MED ORDER — DOXYCYCLINE HYCLATE 100 MG PO CAPS: 100.0000 mg | ORAL_CAPSULE | Freq: Two times a day (BID) | ORAL | 0 refills | Status: AC

## 2023-12-20 NOTE — ED Triage Notes (Addendum)
 Coming from home, Pt reports small abscess like bump to R thumb x2 weeks, yesterday it increased in size and puss started to drain. Tenderness to wrist as well. Pt reports using dental stick to Marc [name] site to decrease some pressure. GCS 15, ambulatory

## 2023-12-20 NOTE — Discharge Instructions (Addendum)
 Please wash the wound daily with soap and water, then you can cover with a bandage.  Take the antibiotics as prescribed.  Please return to the ED if you have any worsening symptoms like increased swelling, pain or redness traveling up your arm.  You should start to see improvement in your symptoms 48 hours, if things are not getting better return to the ED.

## 2023-12-20 NOTE — ED Provider Notes (Signed)
 [facility] ([facility]) Provider Note    Event Date/Time   First MD Initiated Contact with Patient 12/20/23 1400     (approximate)   History   Abscess   HPI  Marc [name] is a 63 y.o. male with PMH of CKD, hypothyroidism and bladder cancer presents for evaluation of an abscess to the right thumb.  Patient states he noticed a small bump there about 2 weeks ago.  However since yesterday that area has become more red and swollen.  He also reports significant swelling to the right hand.  He denies any fevers at home.  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     Physical Exam   Triage Vital Signs: ED Triage Vitals  Encounter Vitals Group     BP 12/20/23 1311 (!) 143/94     Systolic BP Percentile --      Diastolic BP Percentile --      Pulse Rate 12/20/23 1306 99     Resp 12/20/23 1306 16     Temp 12/20/23 1306 98.1 F (36.7 C)     Temp Source 12/20/23 1306 Oral     SpO2 12/20/23 1306 95 %     Weight 12/20/23 1307 176 lb (79.8 kg)     Height 12/20/23 1307 6' 2 (1.88 m)     Head Circumference --      Peak Flow --      Pain Score 12/20/23 1307 2     Pain Loc --      Pain Education --      Exclude from Growth Chart --     Most recent vital signs: Vitals:   12/20/23 1306 12/20/23 1311  BP:  (!) 143/94  Pulse: 99   Resp: 16   Temp: 98.1 F (36.7 C)   SpO2: 95%    General: Awake, no distress.  CV:  Good peripheral perfusion.  Resp:  Normal effort.  Abd:  No distention.  Other:  Right hand is noticeably swollen when compared to the left, full ROM of thumb maintained and does not produce pain, area of swelling and fluctuance to the dorsal side of the right thumb, right hand is erythematous and warm.  Radial pulse 2+ and regular.  ED Results / Procedures / Treatments   Labs (all labs ordered are listed, but only abnormal results are displayed) Labs Reviewed - No  data to display   PROCEDURES:  Critical Care performed: No  .Incision and Drainage  Date/Time: 12/20/2023 3:01 PM  Performed by: Cleaster Tinnie LABOR, PA-C Authorized by: Cleaster Tinnie LABOR, PA-C   Consent:    Consent obtained:  Verbal   Consent given by:  Patient   Risks discussed:  Bleeding, incomplete drainage, pain, infection and damage to other organs   Alternatives discussed:  No treatment Universal protocol:    Patient identity confirmed:  Verbally with patient Location:    Type:  Abscess   Size:  1 cm   Location:  Upper extremity   Upper extremity location:  Finger   Finger location:  L thumb Pre-procedure details:    Skin preparation:  Povidone-iodine Sedation:    Sedation type:  None Anesthesia:    Anesthesia method:  Local infiltration   Local anesthetic:  Lidocaine  1% w/o epi Procedure type:    Complexity:  Simple Procedure details:    Incision types:  Single straight   Incision depth:  Dermal   Wound management:  Irrigated with saline   Drainage:  Purulent   Drainage amount:  Scant   Wound treatment:  Wound left open Post-procedure details:    Procedure completion:  Tolerated well, no immediate complications    MEDICATIONS ORDERED IN ED: Medications  lidocaine  (PF) (XYLOCAINE ) 1 % injection 5 mL (5 mLs Intradermal Given 12/20/23 1434)     IMPRESSION / MDM / ASSESSMENT AND PLAN / ED COURSE  I reviewed the triage vital signs and the nursing notes.                             63 year old male presents for evaluation of abscess to the right thumb.  Patient was hypertensive in triage otherwise vital signs are stable.  Patient NAD on exam.  Differential diagnosis includes, but is not limited to, abscess, felon, flexor tendon synovitis, cellulitis, dactylitis.  Patient's presentation is most consistent with acute, uncomplicated illness.  Abscess drained as described in the procedure note above.  Patient will be started on oral antibiotics.  He was  given return precautions.  He was instructed on wound care.  He voiced understanding, all questions were answered and he was stable at discharge.     FINAL CLINICAL IMPRESSION(S) / ED DIAGNOSES   Final diagnoses:  Abscess of left thumb  Cellulitis of right hand     Rx / DC Orders   ED Discharge Orders          Ordered    doxycycline  (VIBRAMYCIN ) 100 MG capsule  2 times daily        12/20/23 1459             Note:  This document was prepared using Dragon voice recognition software and may include unintentional dictation errors.   Cleaster Tinnie LABOR, PA-C 12/20/23 1504    Viviann Pastor, MD 12/21/23 2038

## 2024-03-18 ENCOUNTER — Other Ambulatory Visit: Payer: Self-pay | Admitting: Physical Medicine & Rehabilitation

## 2024-03-18 DIAGNOSIS — M5441 Lumbago with sciatica, right side: Secondary | ICD-10-CM

## 2024-03-19 ENCOUNTER — Ambulatory Visit
Admission: RE | Admit: 2024-03-19 | Discharge: 2024-03-19 | Disposition: A | Discharge status: Home / Self Care | Source: Ambulatory Visit | Attending: Physical Medicine & Rehabilitation | Admitting: Physical Medicine & Rehabilitation

## 2024-03-19 DIAGNOSIS — M5441 Lumbago with sciatica, right side: Secondary | ICD-10-CM

## 2024-04-05 ENCOUNTER — Inpatient Hospital Stay
Admission: EM | Admit: 2024-04-05 | Discharge: 2024-04-07 | DRG: 322 | Disposition: A | Discharge status: Home / Self Care | Attending: Student | Admitting: Student

## 2024-04-05 ENCOUNTER — Other Ambulatory Visit: Payer: Self-pay

## 2024-04-05 ENCOUNTER — Emergency Department

## 2024-04-05 DIAGNOSIS — I214 Non-ST elevation (NSTEMI) myocardial infarction: Principal | ICD-10-CM | POA: Diagnosis present

## 2024-04-05 DIAGNOSIS — Z91030 Bee allergy status: Secondary | ICD-10-CM

## 2024-04-05 DIAGNOSIS — Z880 Allergy status to penicillin: Secondary | ICD-10-CM

## 2024-04-05 DIAGNOSIS — I252 Old myocardial infarction: Secondary | ICD-10-CM

## 2024-04-05 DIAGNOSIS — I200 Unstable angina: Principal | ICD-10-CM

## 2024-04-05 DIAGNOSIS — Z85528 Personal history of other malignant neoplasm of kidney: Secondary | ICD-10-CM

## 2024-04-05 DIAGNOSIS — Z882 Allergy status to sulfonamides status: Secondary | ICD-10-CM

## 2024-04-05 DIAGNOSIS — F1210 Cannabis abuse, uncomplicated: Secondary | ICD-10-CM | POA: Diagnosis present

## 2024-04-05 DIAGNOSIS — Z8551 Personal history of malignant neoplasm of bladder: Secondary | ICD-10-CM

## 2024-04-05 DIAGNOSIS — R079 Chest pain, unspecified: Secondary | ICD-10-CM | POA: Diagnosis not present

## 2024-04-05 DIAGNOSIS — E039 Hypothyroidism, unspecified: Secondary | ICD-10-CM | POA: Diagnosis present

## 2024-04-05 DIAGNOSIS — Z79890 Hormone replacement therapy (postmenopausal): Secondary | ICD-10-CM

## 2024-04-05 DIAGNOSIS — I2510 Atherosclerotic heart disease of native coronary artery without angina pectoris: Secondary | ICD-10-CM

## 2024-04-05 DIAGNOSIS — Z79899 Other long term (current) drug therapy: Secondary | ICD-10-CM

## 2024-04-05 DIAGNOSIS — E785 Hyperlipidemia, unspecified: Secondary | ICD-10-CM | POA: Diagnosis present

## 2024-04-05 DIAGNOSIS — Z87891 Personal history of nicotine dependence: Secondary | ICD-10-CM

## 2024-04-05 DIAGNOSIS — R011 Cardiac murmur, unspecified: Secondary | ICD-10-CM | POA: Diagnosis present

## 2024-04-05 DIAGNOSIS — Z8249 Family history of ischemic heart disease and other diseases of the circulatory system: Secondary | ICD-10-CM

## 2024-04-05 DIAGNOSIS — M1990 Unspecified osteoarthritis, unspecified site: Secondary | ICD-10-CM | POA: Diagnosis present

## 2024-04-05 DIAGNOSIS — I129 Hypertensive chronic kidney disease with stage 1 through stage 4 chronic kidney disease, or unspecified chronic kidney disease: Secondary | ICD-10-CM | POA: Diagnosis present

## 2024-04-05 DIAGNOSIS — Z905 Acquired absence of kidney: Secondary | ICD-10-CM

## 2024-04-05 HISTORY — DX: Atherosclerotic heart disease of native coronary artery without angina pectoris: I25.10

## 2024-04-05 HISTORY — DX: Unspecified osteoarthritis, unspecified site: M19.90

## 2024-04-05 LAB — CBC
HCT: 48 % (ref 39.0–52.0)
Hemoglobin: 16 g/dL (ref 13.0–17.0)
MCH: 30.9 pg (ref 26.0–34.0)
MCHC: 33.3 g/dL (ref 30.0–36.0)
MCV: 92.8 fL (ref 80.0–100.0)
Platelets: 235 10*3/uL (ref 150–400)
RBC: 5.17 MIL/uL (ref 4.22–5.81)
RDW: 13 % (ref 11.5–15.5)
WBC: 9.4 10*3/uL (ref 4.0–10.5)
nRBC: 0 % (ref 0.0–0.2)

## 2024-04-05 LAB — BASIC METABOLIC PANEL WITH GFR
Anion gap: 10 (ref 5–15)
BUN: 27 mg/dL — ABNORMAL HIGH (ref 8–23)
CO2: 24 mmol/L (ref 22–32)
Calcium: 9.4 mg/dL (ref 8.9–10.3)
Chloride: 102 mmol/L (ref 98–111)
Creatinine, Ser: 1.34 mg/dL — ABNORMAL HIGH (ref 0.61–1.24)
GFR, Estimated: 59 mL/min — ABNORMAL LOW (ref >60)
Glucose, Bld: 93 mg/dL (ref 70–99)
Potassium: 4.5 mmol/L (ref 3.5–5.1)
Sodium: 136 mmol/L (ref 135–145)

## 2024-04-05 LAB — PROTIME-INR
INR: 0.9 (ref 0.8–1.2)
Prothrombin Time: 12.6 s (ref 11.4–15.2)

## 2024-04-05 LAB — TROPONIN I (HIGH SENSITIVITY)
Troponin I (High Sensitivity): 47 ng/L — ABNORMAL HIGH (ref <18)
Troponin I (High Sensitivity): 47 ng/L — ABNORMAL HIGH (ref <18)

## 2024-04-05 LAB — BRAIN NATRIURETIC PEPTIDE: B Natriuretic Peptide: 32.7 pg/mL (ref 0.0–100.0)

## 2024-04-05 LAB — APTT: aPTT: 30 s (ref 24–36)

## 2024-04-05 MED ORDER — MORPHINE SULFATE (PF) 2 MG/ML IV SOLN: 2.0000 mg | INTRAVENOUS | Status: DC | PRN

## 2024-04-05 MED ORDER — ONDANSETRON HCL 4 MG/2ML IJ SOLN: 4.0000 mg | Freq: Four times a day (QID) | INTRAMUSCULAR | Status: DC | PRN

## 2024-04-05 MED ORDER — ACETAMINOPHEN 325 MG PO TABS
650.0000 mg | ORAL_TABLET | ORAL | Status: DC | PRN
  Administered 2024-04-06 (×2): 650 mg via ORAL
  Filled 2024-04-05 (×2): qty 2

## 2024-04-05 MED ORDER — LEVOTHYROXINE SODIUM 100 MCG PO TABS
200.0000 ug | ORAL_TABLET | Freq: Every day | ORAL | Status: DC
  Administered 2024-04-07: 200 ug via ORAL
  Filled 2024-04-05 (×2): qty 2

## 2024-04-05 MED ORDER — HEPARIN (PORCINE) 25000 UT/250ML-% IV SOLN
1450.0000 [IU]/h | INTRAVENOUS | Status: DC
  Administered 2024-04-05: 1250 [IU]/h via INTRAVENOUS
  Filled 2024-04-05: qty 250

## 2024-04-05 MED ORDER — ASPIRIN 81 MG PO TBEC
81.0000 mg | DELAYED_RELEASE_TABLET | Freq: Every day | ORAL | Status: DC
  Administered 2024-04-07: 81 mg via ORAL
  Filled 2024-04-05: qty 1

## 2024-04-05 MED ORDER — ASPIRIN 81 MG PO CHEW
324.0000 mg | CHEWABLE_TABLET | Freq: Once | ORAL | Status: AC
  Administered 2024-04-05: 324 mg via ORAL
  Filled 2024-04-05: qty 4

## 2024-04-05 MED ORDER — ATORVASTATIN CALCIUM 80 MG PO TABS
80.0000 mg | ORAL_TABLET | Freq: Every day | ORAL | Status: DC
  Administered 2024-04-06 – 2024-04-07 (×2): 80 mg via ORAL
  Filled 2024-04-05 (×2): qty 1

## 2024-04-05 MED ORDER — NITROGLYCERIN 0.4 MG SL SUBL: 0.4000 mg | SUBLINGUAL_TABLET | SUBLINGUAL | Status: DC | PRN

## 2024-04-05 MED ORDER — HEPARIN BOLUS VIA INFUSION
4000.0000 [IU] | Freq: Once | INTRAVENOUS | Status: AC
  Administered 2024-04-05: 4000 [IU] via INTRAVENOUS
  Filled 2024-04-05: qty 4000

## 2024-04-05 NOTE — H&P (Signed)
 History and Physical    Patient: Marc [name] MVH:[id number] DOB: [birth year] DOA: 04/05/2024 DOS: the patient was seen and examined on 04/05/2024 PCP: Lyle San, MD  Patient coming from: Home  Chief Complaint:  Chief Complaint  Patient presents with   Chest Pain    HPI: Marc [name] is a 63 y.o. male with medical history significant for HTN, HLD NSTEMI 2022 s/p PCI, with mild LV dysfunction at the time with EF 40%, who presents to the ED with a 5-day history of chest pain worse in the hours prior to arrival.  He has been using sublingual nitroglycerin  for the past several days with adequate relief however on the night of arrival he feels to get relief with repeated nitroglycerin  tablets thus prompting the visit to the ED.  Pain is described as tightness in the mid chest radiating down the right arm and he denies nausea vomiting shortness of breath, lightheadedness or dizziness. ED course and data review: Vitals within normal limits. Labs notable for mildly elevated troponin of 47-47.  Creatinine 1.34 with no recent baseline.  CBC, BMP unremarkable.  BNP normal. EKG, personally viewed and interpreted showing NSR at 79 with no acute ST-T wave changes Chest x-ray nonacute  Patient started on heparin  infusion Hospitalist consulted for admission.     Review of Systems: As mentioned in the history of present illness. All other systems reviewed and are negative.  Past Medical History:  Diagnosis Date   Arthritis    Cancer (HCC)    renal cell 2 years ago and bladder cancer 2004    Chronic kidney disease    right kidney removed 2 years ago renal cell   Coronary artery disease    Heart murmur    History of bladder cancer    Hypothyroidism    NSTEMI (non-ST elevated myocardial infarction) (HCC) 2023   Past Surgical History:  Procedure Laterality Date   APPENDECTOMY     BLADDER SURGERY     CORONARY STENT INTERVENTION N/A 01/30/2021   Procedure: CORONARY STENT INTERVENTION;   Surgeon: Percival Brace, MD;  Location: [facility] INVASIVE CV [facility];  Service: Cardiovascular;  Laterality: N/A;   HERNIA REPAIR     kidney removed Right    LEFT HEART CATH AND CORONARY ANGIOGRAPHY N/A 01/30/2021   Procedure: LEFT HEART CATH AND CORONARY ANGIOGRAPHY;  Surgeon: Michelle Aid, MD;  Location: [facility] INVASIVE CV [facility];  Service: Cardiovascular;  Laterality: N/A;   LUMBAR LAMINECTOMY/DECOMPRESSION MICRODISCECTOMY N/A 05/30/2016   Procedure: LUMBAR DECOMPRESSION DISCECTOMY L2-3;  Surgeon: Mort Ards, MD;  Location: [facility] OR;  Service: Orthopedics;  Laterality: N/A;   TONSILLECTOMY     Social History:  reports that he quit smoking about 21 years ago. His smoking use included cigarettes. He started smoking about 51 years ago. He has a 60 pack-year smoking history. He quit smokeless tobacco use about 8 years ago.  His smokeless tobacco use included snuff. He reports current drug use. Drug: Marijuana. He reports that he does not drink alcohol.  Allergies  Allergen Reactions   Bee Venom Swelling   Amoxicillin Hives   Sulfamethoxazole-Trimethoprim Nausea Only    Upset stomach    Family History  Problem Relation Age of Onset   Heart failure Father     Prior to Admission medications   Medication Sig Start Date End Date Taking? Authorizing Provider  acetaminophen  (TYLENOL ) 500 MG tablet Take 1,000 mg by mouth every morning.    [provider]  atorvastatin  (LIPITOR ) 80 MG tablet  Take 1 tablet (80 mg total) by mouth daily. 01/31/21 03/02/21  Brenna Cam, MD  levothyroxine  (SYNTHROID ) 200 MCG tablet Take 200 mcg by mouth daily before breakfast.  01/16/19 01/28/21  [provider]  levothyroxine  (SYNTHROID ) 200 MCG tablet TAKE 1 TABLET(200 MCG) BY MOUTH EVERY DAY 30 TO 60 MINUTES BEFORE BREAKFAST ON AN EMPTY STOMACH AND WITH A GLASS OF WATER 10/03/20   [provider]  nitroGLYCERIN  (NITROSTAT ) 0.4 MG SL tablet Place 1 tablet (0.4 mg total) under the tongue every 5  (five) minutes x 3 doses as needed for chest pain. 01/31/21   Brenna Cam, MD  timolol  (TIMOPTIC ) 0.5 % ophthalmic solution Place 1 drop into the left eye daily. 02/27/19   [provider]    Physical Exam: Vitals:   04/05/24 1929 04/05/24 2000 04/05/24 2100 04/05/24 2200  BP:  (!) 127/94 (!) 147/85 (!) 125/90  Pulse:  61 64 65  Resp:  14 14 14   Temp:      TempSrc:      SpO2:  95% 97% 94%  Weight: 118.3 kg     Height: 6\' 2"  (1.88 m)      Physical Exam Vitals and nursing note reviewed.  Constitutional:      General: He is not in acute distress. HENT:     Head: Normocephalic and atraumatic.  Cardiovascular:     Rate and Rhythm: Normal rate and regular rhythm.     Heart sounds: Normal heart sounds.  Pulmonary:     Effort: Pulmonary effort is normal.     Breath sounds: Normal breath sounds.  Abdominal:     Palpations: Abdomen is soft.     Tenderness: There is no abdominal tenderness.  Neurological:     Mental Status: Mental status is at baseline.     Labs on Admission: I have personally reviewed following labs and imaging studies  CBC: Recent Labs  Lab 04/05/24 1707  WBC 9.4  HGB 16.0  HCT 48.0  MCV 92.8  PLT 235   Basic Metabolic Panel: Recent Labs  Lab 04/05/24 1707  NA 136  K 4.5  CL 102  CO2 24  GLUCOSE 93  BUN 27*  CREATININE 1.34*  CALCIUM  9.4   GFR: Estimated Creatinine Clearance: 76.1 mL/min (A) (by C-G formula based on SCr of 1.34 mg/dL (H)). Liver Function Tests: No results for input(s): "AST", "ALT", "ALKPHOS", "BILITOT", "PROT", "ALBUMIN " in the last 168 hours. No results for input(s): "LIPASE", "AMYLASE" in the last 168 hours. No results for input(s): "AMMONIA" in the last 168 hours. Coagulation Profile: Recent Labs  Lab 04/05/24 1938  INR 0.9   Cardiac Enzymes: No results for input(s): "CKTOTAL", "CKMB", "CKMBINDEX", "TROPONINI" in the last 168 hours. BNP (last 3 results) No results for input(s): "PROBNP" in the last 8760  hours. HbA1C: No results for input(s): "HGBA1C" in the last 72 hours. CBG: No results for input(s): "GLUCAP" in the last 168 hours. Lipid Profile: No results for input(s): "CHOL", "HDL", "LDLCALC", "TRIG", "CHOLHDL", "LDLDIRECT" in the last 72 hours. Thyroid Function Tests: No results for input(s): "TSH", "T4TOTAL", "FREET4", "T3FREE", "THYROIDAB" in the last 72 hours. Anemia Panel: No results for input(s): "VITAMINB12", "FOLATE", "FERRITIN", "TIBC", "IRON", "RETICCTPCT" in the last 72 hours. Urine analysis:    Component Value Date/Time   APPEARANCEUR Clear 07/19/2023 0814   GLUCOSEU Negative 07/19/2023 0814   BILIRUBINUR Negative 07/19/2023 0814   PROTEINUR Negative 07/19/2023 0814   NITRITE Negative 07/19/2023 0814   LEUKOCYTESUR Negative 07/19/2023 1610  Radiological Exams on Admission: DG Chest 2 View Result Date: 04/05/2024 EXAM: 2 VIEW(S) XRAY OF THE CHEST 04/05/2024 05:19:47 PM COMPARISON: 01/28/2021 CLINICAL HISTORY: CP. Chest pain x 5 days that worsened today with radiation of pain down right arm. Hx of cardiac stent placement, NSTEMI-2023. FINDINGS: LUNGS AND PLEURA: No consolidation. No pulmonary edema. No pleural effusion. No pneumothorax. HEART AND MEDIASTINUM: No acute abnormality of the cardiac and mediastinal silhouettes. BONES AND SOFT TISSUES: Degenerative changes of the visualized thoracolumbar spine. No acute osseous abnormality. IMPRESSION: 1. No acute cardiopulmonary pathology. Electronically signed by: Zadie Herter MD 04/05/2024 07:24 PM EDT RP Workstation: [id number]   Data Reviewed for HPI: Relevant notes from primary care and specialist visits, past discharge summaries as available in EHR, including Care Everywhere. Prior diagnostic testing as pertinent to current admission diagnoses Updated medications and problem lists for reconciliation ED course, including vitals, labs, imaging, treatment and response to treatment Triage notes, nursing and pharmacy  notes and ED provider's notes Notable results as noted above in HPI      Assessment and Plan: Unstable angina (HCC) CAD with history of NSTEMI 2022 s/p PCI 5 days of chest pain unrelieved with nitro.  Troponin 47->47 Continue heparin  infusion Aspirin , atorvastatin  Nitroglycerin  sublingual as needed chest pain with morphine  for breakthrough Consult cardiology Will keep n.p.o. after midnight for possible procedure  History of renal cell cancer No acute issues suspected at this time  Hypothyroidism Continue levothyroxine     DVT prophylaxis: heparin   Consults: [facility] cardiology  Advance Care Planning:   Code Status: Prior   Family Communication: none  Disposition Plan: Back to previous home environment  Severity of Illness: The appropriate patient status for this patient is OBSERVATION. Observation status is judged to be reasonable and necessary in order to provide the required intensity of service to ensure the patient's safety. The patient's presenting symptoms, physical exam findings, and initial radiographic and laboratory data in the context of their medical condition is felt to place them at decreased risk for further clinical deterioration. Furthermore, it is anticipated that the patient will be medically stable for discharge from the hospital within 2 midnights of admission.   Author: Lanetta Pion, MD 04/05/2024 11:14 PM  For on call review [email or web address].

## 2024-04-05 NOTE — Assessment & Plan Note (Signed)
 Continue levothyroxine

## 2024-04-05 NOTE — ED Triage Notes (Signed)
 Pt to ED for chest pain since 5 days, worse since 4pm today. Has been taking PRN NTG last several days and took SL NTG 1 hour ago. Pain is to mid chest and down R arm.  Denies N/V, SOB and dizziness.  Cardiac hx: 3 years ago had stent placed for NSTEMI. Pt has 1 kidney (L). Not on blood thinners. Skin is dry, respirations unlabored.

## 2024-04-05 NOTE — Assessment & Plan Note (Signed)
No acute issues suspected at this time

## 2024-04-05 NOTE — ED Provider Notes (Signed)
 [facility] Provider Note    Event Date/Time   First MD Initiated Contact with Patient 04/05/24 1719     (approximate)   History   Chest Pain   HPI  Marc [name] is a 63 y.o. male who comes in with concerns for chest pain.  Patient has a history of NSTEMI with stent placed 3 years ago he has got 1 kidney.  Not on any blood thinners.  He reports having 5 days of chest pain going down to the right arm.  Took nitro.  Patient reports that the pain comes on at random times.  States that it does improve with nitro and then comes back.  He denies any abdominal pain.  Denies it radiating to his back, tearing pain.  He denies any shortness of breath associated with it.  Currently he really does not have any symptoms.  He states this feels similar to when he had to have a stent placed before.  He denies any smoking history  Physical Exam   Triage Vital Signs: ED Triage Vitals  Encounter Vitals Group     BP 04/05/24 1706 (!) 132/93     Systolic BP Percentile --      Diastolic BP Percentile --      Pulse Rate 04/05/24 1706 84     Resp 04/05/24 1706 20     Temp 04/05/24 1706 97.7 F (36.5 C)     Temp Source 04/05/24 1706 Oral     SpO2 04/05/24 1706 94 %     Weight --      Height --      Head Circumference --      Peak Flow --      Pain Score 04/05/24 1705 2     Pain Loc --      Pain Education --      Exclude from Growth Chart --     Most recent vital signs: Vitals:   04/05/24 1706  BP: (!) 132/93  Pulse: 84  Resp: 20  Temp: 97.7 F (36.5 C)  SpO2: 94%     General: Awake, no distress.  CV:  Good peripheral perfusion. No murmur Resp:  Normal effort.  Clear lungs Abd:  No distention.  Other:  No swelling in legs.  No calf tenderness Equal sensation, good distal pulses   ED Results / Procedures / Treatments   Labs (all labs ordered are listed, but only abnormal results are displayed) Labs Reviewed  BASIC METABOLIC PANEL WITH GFR  CBC   TROPONIN I (HIGH SENSITIVITY)     EKG  My interpretation of EKG:  Normal sinus rate of 79 without any ST elevation, T wave version in lead III aVF.  T wave inversions are similar to 2022  RADIOLOGY I have reviewed the xray personally and interpreted no evidence of widened mediastinum, pneumonia   PROCEDURES:  Critical Care performed: Yes, see critical care procedure note(s)  .1-3 Lead EKG Interpretation  Performed by: Lubertha Rush, MD Authorized by: Lubertha Rush, MD     Interpretation: normal     ECG rate:  80   ECG rate assessment: normal     Rhythm: sinus rhythm     Ectopy: none     Conduction: normal   .Critical Care  Performed by: Lubertha Rush, MD Authorized by: Lubertha Rush, MD   Critical care provider statement:    Critical care time (minutes):  30   Critical care was necessary to  treat or prevent imminent or life-threatening deterioration of the following conditions:  Cardiac failure   Critical care was time spent personally by me on the following activities:  Development of treatment plan with patient or surrogate, discussions with consultants, evaluation of patient's response to treatment, examination of patient, ordering and review of laboratory studies, ordering and review of radiographic studies, ordering and performing treatments and interventions, pulse oximetry, re-evaluation of patient's condition and review of old charts    MEDICATIONS ORDERED IN ED: Medications  aspirin  chewable tablet 324 mg (324 mg Oral Given 04/05/24 1748)     IMPRESSION / MDM / ASSESSMENT AND PLAN / ED COURSE  I reviewed the triage vital signs and the nursing notes.   Patient's presentation is most consistent with acute presentation with potential threat to life or bodily function.       INITIAL IMPRESSION / ASSESSMENT AND PLAN / ED COURSE   Most Likely DDx:  -Consider ACS vs MSK Vs Marc [name]- will get EKG/troponin to evaluate for ACS       DDx that was also  considered d/t potential to cause harm, but was found less likely based on history and physical (as detailed above): -PNA (no fevers, cough but CXR to evaluate) -PNX (reassured with equal b/l breath sounds, CXR to evaluate) -Symptomatic anemia (will get H&H) -Pulmonary embolism as no sob at rest, not pleuritic in nature -Aortic Dissection as no tearing pain and no radiation to the mid back, pulses equal, no murmur -Pericarditis no EKG changes or hx to suggest dx -Tamponade (no notable SOB, tachycardic, hypotensive) -Esophageal rupture (no h/o diffuse vomitting/no crepitus)   7:03 PM repeat evaluation patient continues deny any chest pain.  Discussed with him he sometimes does have some intermittent low oxygen levels of 94% but he reports a history of some shallow breathing he denies any shortness of breath or symptoms at this time.  History is certainly concerning his troponin is slightly elevated his heart score is 6 putting him at moderate risk.  Discussed with patient options of repeating troponin and potentially going home versus admission given his story seems very concerning, patient is willing to be admitted to the hospital for cardiac monitoring.  Will cover patient with heparin  is already received aspirin .  He denies any current pain.  I will discuss the hospital team for admission.  BNP is normal BMP shows slight elevation of creatinine.  CBC reassuring    Note:  This document was prepared using Dragon voice recognition software and may include unintentional dictation errors.   The patient is on the cardiac monitor to evaluate for evidence of arrhythmia and/or significant heart rate changes.      FINAL CLINICAL IMPRESSION(S) / ED DIAGNOSES   Final diagnoses:  Unstable angina (HCC)     Rx / DC Orders   ED Discharge Orders     None        Note:  This document was prepared using Dragon voice recognition software and may include unintentional dictation errors.    Lubertha Rush, MD 04/05/24 [name]

## 2024-04-05 NOTE — ED Notes (Signed)
 The pt was upset and requesting to be d/c'd early but has became calm after meeting request. Pt was given a hospital bed placed in his room for comfort, all medical equipment placed on one side per pt request, offered hydration, and  updated about admission. Call bell placed within pt's reach.

## 2024-04-05 NOTE — Assessment & Plan Note (Signed)
 CAD with history of NSTEMI 2022 s/p PCI 5 days of chest pain unrelieved with nitro.  Troponin 47->47 Continue heparin  infusion Aspirin , atorvastatin  Nitroglycerin  sublingual as needed chest pain with morphine  for breakthrough Consult cardiology Will keep n.p.o. after midnight for possible procedure

## 2024-04-05 NOTE — ED Notes (Signed)
 Pt was assisted to the restroom. The pt stated frustration with sitting in the hospital and being hooked up to the monitor and heparin  drip also stated that he was ready to go home. The patient was educated on why he needs to remain in the hospital and on the equipment. Nurse was notified

## 2024-04-05 NOTE — Progress Notes (Signed)
 PHARMACY - ANTICOAGULATION CONSULT NOTE  Pharmacy Consult for Heparin  Infusion Indication: chest pain/ACS  Allergies  Allergen Reactions   Bee Venom Swelling   Amoxicillin Hives   Sulfamethoxazole-Trimethoprim Nausea Only    Upset stomach    Patient Measurements: Height: 6\' 2"  (188 cm) Weight: 118.3 kg (260 lb 12.8 oz) IBW/kg (Calculated) : 82.2 HEPARIN  DW (KG): 107.4  Vital Signs: Temp: 97.7 F (36.5 C) (04/27 1706) Temp Source: Oral (04/27 1706) BP: 132/93 (04/27 1706) Pulse Rate: 84 (04/27 1706)  Labs: Recent Labs    04/05/24 1707  HGB 16.0  HCT 48.0  PLT 235  CREATININE 1.34*  TROPONINIHS 47*    Estimated Creatinine Clearance: 76.1 mL/min (A) (by C-G formula based on SCr of 1.34 mg/dL (H)).   Medical History: Past Medical History:  Diagnosis Date   Arthritis    Cancer (HCC)    renal cell 2 years ago and bladder cancer 2004    Chronic kidney disease    right kidney removed 2 years ago renal cell   Coronary artery disease    Heart murmur    History of bladder cancer    Hypothyroidism    NSTEMI (non-ST elevated myocardial infarction) (HCC) 2023    Medications:  Not on anticoagulation per chart review  Assessment: Patient is a 63 year old male who presented to the ED reporting chest pain x 5 days that worsened starting at 1600 today. Patient reports taking SL NTG PRN the last several days. Patient has history of NSTEMI with stent placement 3 years ago. Not on blood thinners. Troponin on arrival 47. Pharmacy consulted to initiate patient on heparin  infusion.  Baseline INR and aPTT ordered.  No signs/symptoms of bleeding noted in chart. Hgb 16. PLT 235.  Goal of Therapy:  Heparin  level 0.3-0.7 units/ml Monitor platelets by anticoagulation protocol: Yes   Plan:  Give 4000 unit bolus x 1 Start heparin  infusion at a rate of 1250 units/hr Check heparin  level 6 hours after start of infusion Monitor CBC daily while on heparin   Alice Innocent,  PharmD Clinical Pharmacist  04/05/2024,7:34 PM

## 2024-04-06 ENCOUNTER — Encounter
Admission: EM | Disposition: A | Discharge status: Home / Self Care | Payer: Self-pay | Source: Home / Self Care | Attending: Student

## 2024-04-06 ENCOUNTER — Other Ambulatory Visit: Payer: Self-pay

## 2024-04-06 ENCOUNTER — Encounter: Payer: Self-pay | Admitting: Cardiology

## 2024-04-06 DIAGNOSIS — Z905 Acquired absence of kidney: Secondary | ICD-10-CM | POA: Diagnosis not present

## 2024-04-06 DIAGNOSIS — Z87891 Personal history of nicotine dependence: Secondary | ICD-10-CM | POA: Diagnosis not present

## 2024-04-06 DIAGNOSIS — I252 Old myocardial infarction: Secondary | ICD-10-CM | POA: Diagnosis not present

## 2024-04-06 DIAGNOSIS — Z79890 Hormone replacement therapy (postmenopausal): Secondary | ICD-10-CM | POA: Diagnosis not present

## 2024-04-06 DIAGNOSIS — F1210 Cannabis abuse, uncomplicated: Secondary | ICD-10-CM | POA: Diagnosis present

## 2024-04-06 DIAGNOSIS — E039 Hypothyroidism, unspecified: Secondary | ICD-10-CM | POA: Diagnosis present

## 2024-04-06 DIAGNOSIS — E785 Hyperlipidemia, unspecified: Secondary | ICD-10-CM | POA: Diagnosis present

## 2024-04-06 DIAGNOSIS — I2510 Atherosclerotic heart disease of native coronary artery without angina pectoris: Secondary | ICD-10-CM | POA: Diagnosis present

## 2024-04-06 DIAGNOSIS — Z880 Allergy status to penicillin: Secondary | ICD-10-CM | POA: Diagnosis not present

## 2024-04-06 DIAGNOSIS — Z79899 Other long term (current) drug therapy: Secondary | ICD-10-CM | POA: Diagnosis not present

## 2024-04-06 DIAGNOSIS — Z882 Allergy status to sulfonamides status: Secondary | ICD-10-CM | POA: Diagnosis not present

## 2024-04-06 DIAGNOSIS — I129 Hypertensive chronic kidney disease with stage 1 through stage 4 chronic kidney disease, or unspecified chronic kidney disease: Secondary | ICD-10-CM | POA: Diagnosis present

## 2024-04-06 DIAGNOSIS — M1990 Unspecified osteoarthritis, unspecified site: Secondary | ICD-10-CM | POA: Diagnosis present

## 2024-04-06 DIAGNOSIS — R011 Cardiac murmur, unspecified: Secondary | ICD-10-CM | POA: Diagnosis present

## 2024-04-06 DIAGNOSIS — Z8249 Family history of ischemic heart disease and other diseases of the circulatory system: Secondary | ICD-10-CM | POA: Diagnosis not present

## 2024-04-06 DIAGNOSIS — Z8551 Personal history of malignant neoplasm of bladder: Secondary | ICD-10-CM | POA: Diagnosis not present

## 2024-04-06 DIAGNOSIS — I200 Unstable angina: Secondary | ICD-10-CM | POA: Diagnosis not present

## 2024-04-06 DIAGNOSIS — I214 Non-ST elevation (NSTEMI) myocardial infarction: Secondary | ICD-10-CM | POA: Diagnosis present

## 2024-04-06 DIAGNOSIS — Z85528 Personal history of other malignant neoplasm of kidney: Secondary | ICD-10-CM | POA: Diagnosis not present

## 2024-04-06 DIAGNOSIS — Z91030 Bee allergy status: Secondary | ICD-10-CM | POA: Diagnosis not present

## 2024-04-06 DIAGNOSIS — R079 Chest pain, unspecified: Secondary | ICD-10-CM | POA: Diagnosis present

## 2024-04-06 HISTORY — PX: LEFT HEART CATH AND CORONARY ANGIOGRAPHY: CATH118249

## 2024-04-06 HISTORY — PX: CORONARY STENT INTERVENTION: CATH118234

## 2024-04-06 LAB — LIPID PANEL
Cholesterol: 218 mg/dL — ABNORMAL HIGH (ref 0–200)
HDL: 50 mg/dL (ref >40)
LDL Cholesterol: UNDETERMINED mg/dL (ref 0–99)
Total CHOL/HDL Ratio: 4.4 ratio
Triglycerides: 418 mg/dL — ABNORMAL HIGH (ref <150)
VLDL: UNDETERMINED mg/dL (ref 0–40)

## 2024-04-06 LAB — BASIC METABOLIC PANEL WITH GFR
Anion gap: 11 (ref 5–15)
BUN: 31 mg/dL — ABNORMAL HIGH (ref 8–23)
CO2: 27 mmol/L (ref 22–32)
Calcium: 9.1 mg/dL (ref 8.9–10.3)
Chloride: 100 mmol/L (ref 98–111)
Creatinine, Ser: 1.13 mg/dL (ref 0.61–1.24)
GFR, Estimated: >60 mL/min (ref >60)
Glucose, Bld: 91 mg/dL (ref 70–99)
Potassium: 4 mmol/L (ref 3.5–5.1)
Sodium: 138 mmol/L (ref 135–145)

## 2024-04-06 LAB — CBC
HCT: 44.8 % (ref 39.0–52.0)
Hemoglobin: 14.8 g/dL (ref 13.0–17.0)
MCH: 30.8 pg (ref 26.0–34.0)
MCHC: 33 g/dL (ref 30.0–36.0)
MCV: 93.1 fL (ref 80.0–100.0)
Platelets: 184 10*3/uL (ref 150–400)
RBC: 4.81 MIL/uL (ref 4.22–5.81)
RDW: 13 % (ref 11.5–15.5)
WBC: 7 10*3/uL (ref 4.0–10.5)
nRBC: 0 % (ref 0.0–0.2)

## 2024-04-06 LAB — HEMOGLOBIN A1C
Hgb A1c MFr Bld: 5.4 % (ref 4.8–5.6)
Mean Plasma Glucose: 108.28 mg/dL

## 2024-04-06 LAB — LDL CHOLESTEROL, DIRECT: Direct LDL: 126 mg/dL — ABNORMAL HIGH (ref 0–99)

## 2024-04-06 LAB — HEPARIN LEVEL (UNFRACTIONATED): Heparin Unfractionated: 0.22 [IU]/mL — ABNORMAL LOW (ref 0.30–0.70)

## 2024-04-06 LAB — HIV ANTIBODY (ROUTINE TESTING W REFLEX): HIV Screen 4th Generation wRfx: NONREACTIVE

## 2024-04-06 SURGERY — LEFT HEART CATH AND CORONARY ANGIOGRAPHY
Anesthesia: Moderate Sedation

## 2024-04-06 MED ORDER — LOSARTAN POTASSIUM 25 MG PO TABS
25.0000 mg | ORAL_TABLET | Freq: Every day | ORAL | Status: DC
  Administered 2024-04-07: 25 mg via ORAL
  Filled 2024-04-06 (×2): qty 1

## 2024-04-06 MED ORDER — MIDAZOLAM HCL 2 MG/2ML IJ SOLN
INTRAMUSCULAR | Status: AC
  Filled 2024-04-06: qty 2

## 2024-04-06 MED ORDER — HEPARIN (PORCINE) IN NACL 1000-0.9 UT/500ML-% IV SOLN
INTRAVENOUS | Status: AC
Start: 2024-04-06 — End: ?
  Filled 2024-04-06: qty 1000

## 2024-04-06 MED ORDER — SODIUM CHLORIDE 0.9% FLUSH: 3.0000 mL | INTRAVENOUS | Status: DC | PRN

## 2024-04-06 MED ORDER — HEPARIN SODIUM (PORCINE) 1000 UNIT/ML IJ SOLN
INTRAMUSCULAR | Status: DC | PRN
  Administered 2024-04-06: 5000 [IU] via INTRAVENOUS
  Administered 2024-04-06: 7000 [IU] via INTRAVENOUS

## 2024-04-06 MED ORDER — SODIUM CHLORIDE 0.9 % IV SOLN: 250.0000 mL | INTRAVENOUS | Status: DC | PRN

## 2024-04-06 MED ORDER — SODIUM CHLORIDE 0.9 % WEIGHT BASED INFUSION
3.0000 mL/kg/h | INTRAVENOUS | Status: AC
  Administered 2024-04-06: 3 mL/kg/h via INTRAVENOUS

## 2024-04-06 MED ORDER — IOHEXOL 300 MG/ML  SOLN
INTRAMUSCULAR | Status: DC | PRN
  Administered 2024-04-06: 220 mL

## 2024-04-06 MED ORDER — SODIUM CHLORIDE 0.9% FLUSH
3.0000 mL | Freq: Two times a day (BID) | INTRAVENOUS | Status: DC
  Administered 2024-04-06 – 2024-04-07 (×2): 3 mL via INTRAVENOUS

## 2024-04-06 MED ORDER — HEPARIN SODIUM (PORCINE) 1000 UNIT/ML IJ SOLN
INTRAMUSCULAR | Status: AC
  Filled 2024-04-06: qty 10

## 2024-04-06 MED ORDER — NITROGLYCERIN 1 MG/10 ML FOR IR/CATH LAB
INTRA_ARTERIAL | Status: DC | PRN
  Administered 2024-04-06 (×2): 200 ug

## 2024-04-06 MED ORDER — METOPROLOL SUCCINATE ER 25 MG PO TB24
12.5000 mg | ORAL_TABLET | Freq: Every day | ORAL | Status: DC
  Administered 2024-04-06 – 2024-04-07 (×2): 12.5 mg via ORAL
  Filled 2024-04-06 (×2): qty 1
  Filled 2024-04-06: qty 0.5

## 2024-04-06 MED ORDER — PRASUGREL HCL 10 MG PO TABS
10.0000 mg | ORAL_TABLET | Freq: Every day | ORAL | Status: DC
  Administered 2024-04-07: 10 mg via ORAL
  Filled 2024-04-06: qty 1

## 2024-04-06 MED ORDER — VERAPAMIL HCL 2.5 MG/ML IV SOLN
INTRAVENOUS | Status: AC
  Filled 2024-04-06: qty 2

## 2024-04-06 MED ORDER — ASPIRIN 81 MG PO CHEW
CHEWABLE_TABLET | ORAL | Status: DC | PRN
  Administered 2024-04-06: 243 mg via ORAL

## 2024-04-06 MED ORDER — HYDRALAZINE HCL 20 MG/ML IJ SOLN: 10.0000 mg | INTRAMUSCULAR | Status: AC | PRN

## 2024-04-06 MED ORDER — LIDOCAINE HCL (PF) 1 % IJ SOLN
INTRAMUSCULAR | Status: DC | PRN
  Administered 2024-04-06: 2 mL

## 2024-04-06 MED ORDER — ASPIRIN 81 MG PO CHEW
81.0000 mg | CHEWABLE_TABLET | Freq: Every day | ORAL | Status: DC
Start: 2024-04-07 — End: 2024-04-07

## 2024-04-06 MED ORDER — SODIUM CHLORIDE 0.9 % WEIGHT BASED INFUSION: 1.0000 mL/kg/h | INTRAVENOUS | Status: DC

## 2024-04-06 MED ORDER — FENTANYL CITRATE (PF) 100 MCG/2ML IJ SOLN
INTRAMUSCULAR | Status: AC
  Filled 2024-04-06: qty 2

## 2024-04-06 MED ORDER — HEPARIN BOLUS VIA INFUSION
1500.0000 [IU] | Freq: Once | INTRAVENOUS | Status: AC
  Administered 2024-04-06: 1500 [IU] via INTRAVENOUS
  Filled 2024-04-06: qty 1500

## 2024-04-06 MED ORDER — ASPIRIN 81 MG PO CHEW: 81.0000 mg | CHEWABLE_TABLET | ORAL | Status: DC

## 2024-04-06 MED ORDER — MIDAZOLAM HCL 2 MG/2ML IJ SOLN
INTRAMUSCULAR | Status: DC | PRN
  Administered 2024-04-06: 1 mg via INTRAVENOUS

## 2024-04-06 MED ORDER — SODIUM CHLORIDE 0.9% FLUSH: 3.0000 mL | Freq: Two times a day (BID) | INTRAVENOUS | Status: DC

## 2024-04-06 MED ORDER — ASPIRIN 81 MG PO CHEW
CHEWABLE_TABLET | ORAL | Status: AC
  Administered 2024-04-06: 81 mg via ORAL
  Filled 2024-04-06: qty 1

## 2024-04-06 MED ORDER — SODIUM CHLORIDE 0.9 % WEIGHT BASED INFUSION
1.0000 mL/kg/h | INTRAVENOUS | Status: AC
Start: 2024-04-06 — End: 2024-04-06
  Administered 2024-04-06: 1 mL/kg/h via INTRAVENOUS

## 2024-04-06 MED ORDER — TRAZODONE HCL 50 MG PO TABS
50.0000 mg | ORAL_TABLET | Freq: Every day | ORAL | Status: DC
  Administered 2024-04-06: 50 mg via ORAL
  Filled 2024-04-06: qty 1

## 2024-04-06 MED ORDER — VERAPAMIL HCL 2.5 MG/ML IV SOLN
INTRAVENOUS | Status: DC | PRN
  Administered 2024-04-06: 2.5 mg via INTRAVENOUS

## 2024-04-06 MED ORDER — PRASUGREL HCL 10 MG PO TABS
ORAL_TABLET | ORAL | Status: DC | PRN
  Administered 2024-04-06: 60 mg via ORAL

## 2024-04-06 MED ORDER — GABAPENTIN 300 MG PO CAPS
300.0000 mg | ORAL_CAPSULE | Freq: Three times a day (TID) | ORAL | Status: DC
  Administered 2024-04-06 – 2024-04-07 (×4): 300 mg via ORAL
  Filled 2024-04-06 (×4): qty 1

## 2024-04-06 MED ORDER — PRASUGREL HCL 10 MG PO TABS
ORAL_TABLET | ORAL | Status: AC
Start: 2024-04-06 — End: ?
  Filled 2024-04-06: qty 6

## 2024-04-06 MED ORDER — HEPARIN (PORCINE) IN NACL 2000-0.9 UNIT/L-% IV SOLN
INTRAVENOUS | Status: DC | PRN
  Administered 2024-04-06: 1000 mL

## 2024-04-06 MED ORDER — DORZOLAMIDE HCL-TIMOLOL MAL 2-0.5 % OP SOLN
1.0000 [drp] | Freq: Two times a day (BID) | OPHTHALMIC | Status: DC
  Administered 2024-04-07: 1 [drp] via OPHTHALMIC
  Filled 2024-04-06 (×2): qty 10

## 2024-04-06 MED ORDER — LIDOCAINE HCL 1 % IJ SOLN
INTRAMUSCULAR | Status: AC
  Filled 2024-04-06: qty 20

## 2024-04-06 MED ORDER — FENTANYL CITRATE (PF) 100 MCG/2ML IJ SOLN
INTRAMUSCULAR | Status: DC | PRN
  Administered 2024-04-06: 25 ug via INTRAVENOUS

## 2024-04-06 MED ORDER — LABETALOL HCL 5 MG/ML IV SOLN: 10.0000 mg | INTRAVENOUS | Status: AC | PRN

## 2024-04-06 MED ORDER — ASPIRIN 81 MG PO CHEW
CHEWABLE_TABLET | ORAL | Status: AC
  Filled 2024-04-06: qty 3

## 2024-04-06 SURGICAL SUPPLY — 18 items
BALLOON TREK RX 2.5X12 (BALLOONS) IMPLANT
BALLOON ~~LOC~~ TREK NEO RX 3.25X12 (BALLOONS) IMPLANT
BALLOON ~~LOC~~ TREK NEO RX 4.5X8 (BALLOONS) IMPLANT
CATH INFINITI 5 FR JL3.5 (CATHETERS) IMPLANT
CATH INFINITI JR4 5F (CATHETERS) IMPLANT
CATH VISTA GUIDE 6FR JR4 ECOPK (CATHETERS) IMPLANT
DEVICE RAD COMP TR BAND LRG (VASCULAR PRODUCTS) IMPLANT
DRAPE BRACHIAL (DRAPES) IMPLANT
GLIDESHEATH SLEND SS 6F .021 (SHEATH) IMPLANT
GUIDEWIRE INQWIRE 1.5J.035X260 (WIRE) IMPLANT
KIT ENCORE 26 ADVANTAGE (KITS) IMPLANT
PACK CARDIAC CATH (CUSTOM PROCEDURE TRAY) ×1 IMPLANT
SET ATX-X65L (MISCELLANEOUS) IMPLANT
STATION PROTECTION PRESSURIZED (MISCELLANEOUS) IMPLANT
STENT ONYX FRONTIER 3.0X15 (Permanent Stent) IMPLANT
STENT ONYX FRONTIER 4.0X12 (Permanent Stent) IMPLANT
TUBING CIL FLEX 10 FLL-RA (TUBING) IMPLANT
WIRE ASAHI PROWATER 180CM (WIRE) IMPLANT

## 2024-04-06 NOTE — ED Notes (Signed)
 Pt was laying in bed with eyes closed quietly. No distressed observed.

## 2024-04-06 NOTE — Consult Note (Signed)
 [facility] CLINIC CARDIOLOGY CONSULT NOTE       Patient ID: THORTON CSIZMADIA MRN: [id number] DOB/AGE: March 16, 1961 63 y.o.  Admit date: 04/05/2024 Referring Physician Dr. Brion Cancel Primary Physician Lyle San, MD  Primary Cardiologist Dr. Bary Likes (last seen 03/2022) Reason for Consultation chest pain  HPI: IOSIF SURACE is a 63 y.o. male  with a past medical history of coronary artery disease s/p DES to Lcx 2022, hypertension, hyperlipidemia who presented to the ED on 04/05/2024 for chest pain. Cardiology was consulted for further evaluation.   Patient reports over the last 5 days he has been having episodes of chest pain. Initially these episodes would resolve with SL nitroglycerin  but last night the episode was more persistent and he decided to come to the ED for further evaluation. Workup in the ED notable for Cr 1.34, K 4.5, Hgb 16.0, WBC 9.4. troponins 47 > 47. CXR relatively unremarkable. Started on IV heparin  in the ED due to concern for unstable angina.   At the time of my evaluation this AM he is resting comfortably in bed with wife present at bedside. We discussed his symptoms in further detail. He states that his CP is similar to the episodes he had in 2022 when he had a NSTEMI and received a stent. Initially his pain would improve at home with nitroglycerin  but yesterday it did not improve despite taking a few doses so he decided to come in. Now is feeling better overall.   Review of systems complete and found to be negative unless listed above    Past Medical History:  Diagnosis Date   Arthritis    Cancer (HCC)    renal cell 2 years ago and bladder cancer 2004    Chronic kidney disease    right kidney removed 2 years ago renal cell   Coronary artery disease    Heart murmur    History of bladder cancer    Hypothyroidism    NSTEMI (non-ST elevated myocardial infarction) (HCC) 2023    Past Surgical History:  Procedure Laterality Date   APPENDECTOMY     BLADDER SURGERY      CORONARY STENT INTERVENTION N/A 01/30/2021   Procedure: CORONARY STENT INTERVENTION;  Surgeon: Percival Brace, MD;  Location: [facility] INVASIVE CV LAB;  Service: Cardiovascular;  Laterality: N/A;   HERNIA REPAIR     kidney removed Right    LEFT HEART CATH AND CORONARY ANGIOGRAPHY N/A 01/30/2021   Procedure: LEFT HEART CATH AND CORONARY ANGIOGRAPHY;  Surgeon: Michelle Aid, MD;  Location: [facility] INVASIVE CV LAB;  Service: Cardiovascular;  Laterality: N/A;   LUMBAR LAMINECTOMY/DECOMPRESSION MICRODISCECTOMY N/A 05/30/2016   Procedure: LUMBAR DECOMPRESSION DISCECTOMY L2-3;  Surgeon: Mort Ards, MD;  Location: [facility] OR;  Service: Orthopedics;  Laterality: N/A;   TONSILLECTOMY      (Not in a hospital admission)  Social History   Socioeconomic History   Marital status: Married    Spouse name: Not on file   Number of children: Not on file   Years of education: Not on file   Highest education level: Not on file  Occupational History   Not on file  Tobacco Use   Smoking status: Former    Current packs/day: 0.00    Average packs/day: 2.0 packs/day for 30.0 years (60.0 ttl pk-yrs)    Types: Cigarettes    Start date: 12/10/1972    Quit date: 12/10/2002    Years since quitting: 21.3   Smokeless tobacco: Former    Types: Snuff  Quit date: 01/11/2016  Vaping Use   Vaping status: Never Used  Substance and Sexual Activity   Alcohol use: No   Drug use: Yes    Types: Marijuana    Comment: not even a joint a day   Sexual activity: Yes    Birth control/protection: None  Other Topics Concern   Not on file  Social History Narrative   Not on file   Social Drivers of Health   Financial Resource Strain: Low Risk  (03/17/2024)   Received from [facility]   Overall Financial Resource Strain (CARDIA)    Difficulty of Paying Living Expenses: Not hard at all  Food Insecurity: No Food Insecurity (04/06/2024)   Hunger Vital Sign    Worried About Running Out of Food in the  Last Year: Never true    Ran Out of Food in the Last Year: Never true  Transportation Needs: No Transportation Needs (04/06/2024)   PRAPARE - Administrator, Civil Service (Medical): No    Lack of Transportation (Non-Medical): No  Physical Activity: Not on file  Stress: Not on file  Social Connections: Not on file  Intimate Partner Violence: Not At Risk (04/06/2024)   Humiliation, Afraid, Rape, and Kick questionnaire    Fear of Current or Ex-Partner: No    Emotionally Abused: No    Physically Abused: No    Sexually Abused: No    Family History  Problem Relation Age of Onset   Heart failure Father      Vitals:   04/06/24 0530 04/06/24 0600 04/06/24 0630 04/06/24 0812  BP: (!) 150/92 (!) 150/99 (!) 149/99 (!) 176/98  Pulse: 70 71 64 69  Resp: 10 11 12 15   Temp:    97.7 F (36.5 C)  TempSrc:    Oral  SpO2: 94% 95% 94% 95%  Weight:      Height:        PHYSICAL EXAM General: Well appearing male, well nourished, in no acute distress. HEENT: Normocephalic and atraumatic. Neck: No JVD.  Lungs: Normal respiratory effort on room. Clear bilaterally to auscultation. No wheezes, crackles, rhonchi.  Heart: HRRR. Normal S1 and S2 without gallops or murmurs.  Abdomen: Non-distended appearing.  Msk: Normal strength and tone for age. Extremities: Warm and well perfused. No clubbing, cyanosis. No edema.  Neuro: Alert and oriented X 3. Psych: Answers questions appropriately.   Labs: Basic Metabolic Panel: Recent Labs    04/05/24 1707 04/06/24 0326  NA 136 138  K 4.5 4.0  CL 102 100  CO2 24 27  GLUCOSE 93 91  BUN 27* 31*  CREATININE 1.34* 1.13  CALCIUM  9.4 9.1   Liver Function Tests: No results for input(s): "AST", "ALT", "ALKPHOS", "BILITOT", "PROT", "ALBUMIN " in the last 72 hours. No results for input(s): "LIPASE", "AMYLASE" in the last 72 hours. CBC: Recent Labs    04/05/24 1707 04/06/24 0327  WBC 9.4 7.0  HGB 16.0 14.8  HCT 48.0 44.8  MCV 92.8 93.1   PLT 235 184   Cardiac Enzymes: Recent Labs    04/05/24 1707 04/05/24 1938  TROPONINIHS 47* 47*   BNP: Recent Labs    04/05/24 1707  BNP 32.7   D-Dimer: No results for input(s): "DDIMER" in the last 72 hours. Hemoglobin A1C: No results for input(s): "HGBA1C" in the last 72 hours. Fasting Lipid Panel: No results for input(s): "CHOL", "HDL", "LDLCALC", "TRIG", "CHOLHDL", "LDLDIRECT" in the last 72 hours. Thyroid Function Tests: No results for input(s): "TSH", "  T4TOTAL", "T3FREE", "THYROIDAB" in the last 72 hours.  Invalid input(s): "FREET3" Anemia Panel: No results for input(s): "VITAMINB12", "FOLATE", "FERRITIN", "TIBC", "IRON", "RETICCTPCT" in the last 72 hours.   Radiology: DG Chest 2 View Result Date: 04/05/2024 EXAM: 2 VIEW(S) XRAY OF THE CHEST 04/05/2024 05:19:47 PM COMPARISON: 01/28/2021 CLINICAL HISTORY: CP. Chest pain x 5 days that worsened today with radiation of pain down right arm. Hx of cardiac stent placement, NSTEMI-2023. FINDINGS: LUNGS AND PLEURA: No consolidation. No pulmonary edema. No pleural effusion. No pneumothorax. HEART AND MEDIASTINUM: No acute abnormality of the cardiac and mediastinal silhouettes. BONES AND SOFT TISSUES: Degenerative changes of the visualized thoracolumbar spine. No acute osseous abnormality. IMPRESSION: 1. No acute cardiopulmonary pathology. Electronically signed by: Zadie Herter MD 04/05/2024 07:24 PM EDT RP Workstation: [id number]   MR LUMBAR SPINE WO CONTRAST Result Date: 03/19/2024 CLINICAL DATA:  Back pain with sciatica. History of lumbar surgery. History of bladder cancer and renal cell carcinoma. EXAM: MRI LUMBAR SPINE WITHOUT CONTRAST TECHNIQUE: Multiplanar, multisequence MR imaging of the lumbar spine was performed. No intravenous contrast was administered. COMPARISON:  CT abdomen and pelvis 04/11/2021. MRI abdomen 05/02/2021. FINDINGS: Segmentation:  Standard. Alignment:  Normal. Vertebrae: No fracture or suspicious marrow  lesion. Hemangioma in the left T12 pedicle and posterior vertebral body. Mild Modic type 1 endplate changes at L1-2. Conus medullaris and cauda equina: Conus extends to the L1-2 level. Conus and cauda equina appear normal. Paraspinal and other soft tissues: Right nephrectomy. T1 and T2 hyperintense lesion in the left kidney corresponding to the hemorrhagic/proteinaceous cyst characterized on the prior MRI and for which no follow-up imaging is required. Disc levels: Diffuse congenital narrowing of the lumbar spinal canal due to short pedicles. T12-L1: A small to moderate-sized central disc extrusion with slight superior migration results in mild spinal stenosis. Patent neural foramina. L1-2: Mild facet hypertrophy without disc herniation or stenosis. L2-3: Prior posterior decompression and. Disc desiccation and mild disc space narrowing. Disc bulging, a small left paracentral disc protrusion, and mild-to-moderate facet hypertrophy result in mild right and moderate left lateral recess stenosis and mild bilateral neural foraminal stenosis without spinal stenosis. L3-4: Disc desiccation and mild disc space narrowing. Left eccentric disc bulging and mild-to-moderate facet hypertrophy result in mild bilateral lateral recess stenosis and mild left neural foraminal stenosis without spinal stenosis. L4-5: Disc desiccation and mild disc space narrowing. Disc bulging and mild facet hypertrophy result in mild-to-moderate bilateral lateral recess stenosis and mild bilateral neural foraminal stenosis without spinal stenosis. L5-S1: Disc desiccation and moderate disc space narrowing. Disc bulging and mild-to-moderate facet hypertrophy result in mild right neural foraminal stenosis without spinal stenosis. IMPRESSION: 1. 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 2. Mild spinal stenosis at T12-L1 due to a disc extrusion. 3. Mild-to-moderate lateral recess stenosis and mild neural  foraminal stenosis at L2-3, L3-4, and L4-5. Electronically Signed   By: Aundra Lee M.D.   On: 03/19/2024 15:42    ECHO ordered  TELEMETRY reviewed by me 04/06/2024: sinus rhythm 70s  EKG reviewed by me: NSR rate 79 bpm  Data reviewed by me 04/06/2024: last 24h vitals tele labs imaging I/O ED provider note, admission H&P  Principal Problem:   Unstable angina [facility]) Active Problems:   Hypothyroidism   History of renal cell cancer   CAD with hx of NSTEMI 2022 S/P PCI    ASSESSMENT AND PLAN:  EBRAHIM BELMAN is a 63 y.o. male  with a past medical history of coronary  artery disease s/p DES to Lcx 2022, hypertension, hyperlipidemia who presented to the ED on 04/05/2024 for chest pain. Cardiology was consulted for further evaluation.   # Unstable angina # Coronary artery disease s/p DES to LCx 2022 # Hypertension # Hyperlipidemia Patient with hx of CAD and prior stenting presented with worsening CP over the last 5 days. Similar to prior anginal episodes in 2022 preceeding NSTEMI. Troponins in the ED 47 > 47. EKG without acute ischemic changes. Chest pain now resolved. -Echo ordered.  -Continue heparin  infusion, plan to DC after LHC.  -S/p ASA 325 mg in the ED. Continue aspirin  81 mg daily and atorvastatin  81 mg daily.  -Has not tolerated metoprolol  in the past with dizziness/fatigue. Consider addition of ARB prior to DC.  -Discussed the risks and benefits of proceeding with LHC for further evaluation with the patient.  He is agreeable to proceed.  NPO until LHC this afternoon (4/28) with Dr. Parks Bollman.  Written consent will be obtained.  Further recommendations following LHC.     TIMI Risk Score for Unstable Angina or Non-ST Elevation MI:   The patient's TIMI risk score is 4, which indicates a 20% risk of all cause mortality, new or recurrent myocardial infarction or need for urgent revascularization in the next 14 days.   This patient's plan of care was discussed and created with Dr.  Parks Bollman and he is in agreement.  Signed: Hamp Levine, PA-C  04/06/2024, 8:40 AM [facility] Cardiology

## 2024-04-06 NOTE — ED Notes (Signed)
 Pt was connected back to monitor after using the in-room toliet. Pt was informed that he may stay here in the ED d/t lack of bed

## 2024-04-06 NOTE — Progress Notes (Signed)
 PHARMACY - ANTICOAGULATION CONSULT NOTE  Pharmacy Consult for Heparin  Infusion Indication: chest pain/ACS  Allergies  Allergen Reactions   Bee Venom Swelling   Amoxicillin Hives   Sulfamethoxazole-Trimethoprim Nausea Only    Upset stomach    Patient Measurements: Height: 6\' 2"  (188 cm) Weight: 118.3 kg (260 lb 12.8 oz) IBW/kg (Calculated) : 82.2 HEPARIN  DW (KG): 107.4  Vital Signs: Temp: 97.6 F (36.4 C) (04/28 0347) Temp Source: Oral (04/28 0347) BP: 149/99 (04/28 0630) Pulse Rate: 64 (04/28 0630)  Labs: Recent Labs    04/05/24 1707 04/05/24 1938 04/06/24 0327  HGB 16.0  --  14.8  HCT 48.0  --  44.8  PLT 235  --  184  APTT  --  30  --   LABPROT  --  12.6  --   INR  --  0.9  --   HEPARINUNFRC  --   --  0.22*  CREATININE 1.34*  --   --   TROPONINIHS 47* 47*  --     Estimated Creatinine Clearance: 76.1 mL/min (A) (by C-G formula based on SCr of 1.34 mg/dL (H)).   Medical History: Past Medical History:  Diagnosis Date   Arthritis    Cancer (HCC)    renal cell 2 years ago and bladder cancer 2004    Chronic kidney disease    right kidney removed 2 years ago renal cell   Coronary artery disease    Heart murmur    History of bladder cancer    Hypothyroidism    NSTEMI (non-ST elevated myocardial infarction) (HCC) 2023    Medications:  Not on anticoagulation per chart review  Assessment: Patient is a 63 year old male who presented to the ED reporting chest pain x 5 days that worsened starting at 1600 today. Patient reports taking SL NTG PRN the last several days. Patient has history of NSTEMI with stent placement 3 years ago. Not on blood thinners. Troponin on arrival 47. Pharmacy consulted to initiate patient on heparin  infusion.  Baseline INR and aPTT ordered.  No signs/symptoms of bleeding noted in chart. Hgb 16. PLT 235.  Goal of Therapy:  Heparin  level 0.3-0.7 units/ml Monitor platelets by anticoagulation protocol: Yes  4/28 0327 HL 0.22,  subtherapeutic @ 1250 u/hr   Plan:  HL is subtherapeutic Give heparin  1500 units IV x 1 Increase heparin  infusion to 1450 units/hr Recheck HL 6 hours after rate change CBC daily while on heparin   Ramonita Burow, PharmD Clinical Pharmacist  04/06/2024,7:00 AM

## 2024-04-06 NOTE — ED Notes (Signed)
 CCMD called to initiate cardiac monitoring

## 2024-04-06 NOTE — Progress Notes (Signed)
 [facility] Progress Note  Patient: Marc [name]    LOV:[id number]  DOA: 04/05/2024     Date of Service: the patient was seen and examined on 04/06/2024  Chief Complaint  Patient presents with   Chest Pain   Brief hospital course:  Marc [name] is a 63 y.o. male with medical history significant for HTN, HLD NSTEMI 2022 s/p PCI, with mild LV dysfunction at the time with EF 40%, who presents to the ED with a 5-day history of chest pain worse in the hours prior to arrival.  He has been using sublingual nitroglycerin  for the past several days with adequate relief however on the night of arrival he feels to get relief with repeated nitroglycerin  tablets thus prompting the visit to the ED.  Pain is described as tightness in the mid chest radiating down the right arm and he denies nausea vomiting shortness of breath, lightheadedness or dizziness. ED course and data review: Vitals within normal limits. Labs notable for mildly elevated troponin of 47-47.  Creatinine 1.34 with no recent baseline.  CBC, BMP unremarkable.  BNP normal. EKG, personally viewed and interpreted showing NSR at 79 with no acute ST-T wave changes Chest x-ray nonacute   Patient started on heparin  infusion Hospitalist consulted for admission.      Assessment and Plan:  Unstable angina  CAD with history of NSTEMI 2022 s/p PCI 5 days of chest pain unrelieved with nitro.  Troponin 47->47 S/p heparin  infusion.  Continue aspirin , atorvastatin  Nitroglycerin  sublingual as needed chest pain with morphine  for breakthrough Consult cardiology Cardiology consulted, s/p cardiac cath, RCA stents x 2 placed.  Patient was started on DAPT. Cardiac cath: 1.  Two-vessel coronary artery disease with patent stent mid left circumflex, and 95% stenosis proximal RCA, and 80% stenosis distal RCA 2.  Mild to moderate reduced left ventricular function with global hypokinesis estimated LVEF 40% 3.  Successful PCI with 4.0 x 12 mm Onyx  Frontier DES proximal RCA, and 3.0 x 15 mm Onyx Frontier DES distal RCA Recommendations 1.  Dual antiplatelet therapy (aspirin  and prasugrel) uninterrupted x 1 year 2.  Aggressive risk factor modification 3.  Probable discharge home in a.m., follow-up 1 to 2 weeks    History of renal cell cancer S/p right nephrectomy, patient has left solitary kidney No acute issues suspected at this time   Hypothyroidism Continue levothyroxine    Marijuana use disorder: Drug abuse dependence counseling done   Body mass index is 33.48 kg/m.  Interventions:  Diet: Heart healthy diet DVT Prophylaxis: s/p heparin  IV infusion, currently DAPT  Advance goals of care discussion: Full code  Family Communication: family was present at bedside, at the time of interview.  The pt provided permission to discuss medical plan with the family. Opportunity was given to ask question and all questions were answered satisfactorily.   Disposition:  Pt is from home, admitted with non-STEMI s/p cardiac catheter and RCA stent insertion done today, which precludes a safe discharge. Discharge to home, when stable and cleared by cardiology.  Most likely tomorrow a.m.  Subjective: No significant events overnight, patient was seen after cardiac cath, tolerated procedure well.  Denied any complaints, no chest pain or palpitation, no shortness of breath.   Physical Exam: General: NAD, lying comfortably Appear in no distress, affect appropriate Eyes: PERRLA ENT: Oral Mucosa Clear, moist  Neck: no JVD,  Cardiovascular: S1 and S2 Present,  Murmur audible?,  Respiratory: good respiratory effort, Bilateral Air entry equal and Decreased, no Crackles,  few off/on  wheezes Abdomen: Bowel Sound present, Soft and no tenderness,  Skin: no rashes Extremities: no Pedal edema, no calf tenderness Neurologic: without any new focal findings Gait not checked due to patient safety concerns  Vitals:   04/06/24 1341 04/06/24 1345  04/06/24 1400 04/06/24 1415  BP: (!) 157/135 (!) 148/99 (!) 137/104 (!) 140/100  Pulse:  66 80 73  Resp:  12 18 13   Temp:      TempSrc:      SpO2:  100% 97% 97%  Weight:      Height:        Intake/Output Summary (Last 24 hours) at 04/06/2024 1433 Last data filed at 04/06/2024 1336 Gross per 24 hour  Intake 147.98 ml  Output 600 ml  Net -452.02 ml   Filed Weights   04/05/24 1929  Weight: 118.3 kg    Data Reviewed: I have personally reviewed and interpreted daily labs, tele strips, imagings as discussed above. I reviewed all nursing notes, pharmacy notes, vitals, pertinent old records I have discussed plan of care as described above with RN and patient/family.  CBC: Recent Labs  Lab 04/05/24 1707 04/06/24 0327  WBC 9.4 7.0  HGB 16.0 14.8  HCT 48.0 44.8  MCV 92.8 93.1  PLT 235 184   Basic Metabolic Panel: Recent Labs  Lab 04/05/24 1707 04/06/24 0326  NA 136 138  K 4.5 4.0  CL 102 100  CO2 24 27  GLUCOSE 93 91  BUN 27* 31*  CREATININE 1.34* 1.13  CALCIUM  9.4 9.1    Studies: CARDIAC CATHETERIZATION Result Date: 04/06/2024   Prox RCA-1 lesion is 50% stenosed.   Prox RCA-2 lesion is 95% stenosed.   Dist RCA-1 lesion is 80% stenosed.   Dist RCA-2 lesion is 50% stenosed.   Mid LAD lesion is 30% stenosed.   Prox LAD lesion is 40% stenosed.   Previously placed Prox Cx to Mid Cx stent of unknown type is  widely patent.   A drug-eluting stent was successfully placed using a STENT ONYX FRONTIER 4.0X12.   A drug-eluting stent was successfully placed using a STENT ONYX FRONTIER 3.0X15.   Post intervention, there is a 0% residual stenosis.   Post intervention, there is a 0% residual stenosis.   There is mild to moderate left ventricular systolic dysfunction.   LV end diastolic pressure is mildly elevated.   The left ventricular ejection fraction is 35-45% by visual estimate. 1.  Two-vessel coronary artery disease with patent stent mid left circumflex, and 95% stenosis proximal  RCA, and 80% stenosis distal RCA 2.  Mild to moderate reduced left ventricular function with global hypokinesis estimated LVEF 40% 3.  Successful PCI with 4.0 x 12 mm Onyx Frontier DES proximal RCA, and 3.0 x 15 mm Onyx Frontier DES distal RCA Recommendations 1.  Dual antiplatelet therapy (aspirin  and prasugrel) uninterrupted x 1 year 2.  Aggressive risk factor modification 3.  Probable discharge home in a.m., follow-up 1 to 2 weeks   DG Chest 2 View Result Date: 04/05/2024 EXAM: 2 VIEW(S) XRAY OF THE CHEST 04/05/2024 05:19:47 PM COMPARISON: 01/28/2021 CLINICAL HISTORY: CP. Chest pain x 5 days that worsened today with radiation of pain down right arm. Hx of cardiac stent placement, NSTEMI-2023. FINDINGS: LUNGS AND PLEURA: No consolidation. No pulmonary edema. No pleural effusion. No pneumothorax. HEART AND MEDIASTINUM: No acute abnormality of the cardiac and mediastinal silhouettes. BONES AND SOFT TISSUES: Degenerative changes of the visualized thoracolumbar spine. No acute osseous abnormality. IMPRESSION: 1.  No acute cardiopulmonary pathology. Electronically signed by: Zadie Herter MD 04/05/2024 07:24 PM EDT RP Workstation: [id number]    Scheduled Meds:  aspirin        [START ON 04/07/2024] aspirin   81 mg Oral Pre-Cath   [START ON 04/07/2024] aspirin   81 mg Oral Daily   [MAR Hold] aspirin  EC  81 mg Oral Daily   [MAR Hold] atorvastatin   80 mg Oral Daily   [MAR Hold] dorzolamide-timolol   1 drop Left Eye BID   [MAR Hold] gabapentin  300 mg Oral TID   [MAR Hold] levothyroxine   200 mcg Oral Q0600   [START ON 04/07/2024] losartan  25 mg Oral Daily   metoprolol  succinate  12.5 mg Oral Daily   [START ON 04/07/2024] prasugrel  10 mg Oral Daily   sodium chloride  flush  3 mL Intravenous Q12H   sodium chloride  flush  3 mL Intravenous Q12H   Continuous Infusions:  sodium chloride      sodium chloride      sodium chloride      sodium chloride      PRN Meds: sodium chloride , sodium chloride , [MAR Hold]  acetaminophen , aspirin , aspirin , fentaNYL , Heparin  (Porcine) in NaCl, heparin  sodium (porcine), hydrALAZINE , iohexol , labetalol , lidocaine  (PF), midazolam , [MAR Hold]  morphine  injection, [MAR Hold] nitroGLYCERIN , nitroGLYCERIN , [MAR Hold] ondansetron  (ZOFRAN ) IV, prasugrel, sodium chloride  flush, sodium chloride  flush, verapamil   Time spent: 55 minutes  Author: Althia Atlas. MD [facility] 04/06/2024 2:33 PM  To reach On-call, see care teams to locate the attending and reach out to them via [email or web address]. If 7PM-7AM, please contact night-coverage If you still have difficulty reaching the attending provider, please page the [facility] (Director on Call) for [facility] on amion for assistance.

## 2024-04-07 ENCOUNTER — Other Ambulatory Visit: Payer: Self-pay

## 2024-04-07 ENCOUNTER — Inpatient Hospital Stay
Admit: 2024-04-07 | Discharge: 2024-04-07 | Disposition: A | Discharge status: Home / Self Care | Attending: Student | Admitting: Student

## 2024-04-07 DIAGNOSIS — I200 Unstable angina: Secondary | ICD-10-CM | POA: Diagnosis not present

## 2024-04-07 LAB — CBC
HCT: 45.7 % (ref 39.0–52.0)
Hemoglobin: 15.9 g/dL (ref 13.0–17.0)
MCH: 31.2 pg (ref 26.0–34.0)
MCHC: 34.8 g/dL (ref 30.0–36.0)
MCV: 89.8 fL (ref 80.0–100.0)
Platelets: 190 10*3/uL (ref 150–400)
RBC: 5.09 MIL/uL (ref 4.22–5.81)
RDW: 12.7 % (ref 11.5–15.5)
WBC: 7.7 10*3/uL (ref 4.0–10.5)
nRBC: 0 % (ref 0.0–0.2)

## 2024-04-07 LAB — MAGNESIUM: Magnesium: 2.1 mg/dL (ref 1.7–2.4)

## 2024-04-07 LAB — ECHOCARDIOGRAM COMPLETE
AR max vel: 1.93 cm2
AV Area VTI: 2 cm2
AV Area mean vel: 1.89 cm2
AV Mean grad: 19.3 mmHg
AV Peak grad: 31.4 mmHg
Ao pk vel: 2.8 m/s
Area-P 1/2: 4.57 cm2
Calc EF: 44.7 %
Height: 74 in
MV VTI: 3.66 cm2
S' Lateral: 4.2 cm
Single Plane A2C EF: 33.7 %
Single Plane A4C EF: 51.4 %
Weight: 3992.97 [oz_av]

## 2024-04-07 LAB — BASIC METABOLIC PANEL WITH GFR
Anion gap: 5 (ref 5–15)
BUN: 20 mg/dL (ref 8–23)
CO2: 23 mmol/L (ref 22–32)
Calcium: 8.9 mg/dL (ref 8.9–10.3)
Chloride: 107 mmol/L (ref 98–111)
Creatinine, Ser: 1.06 mg/dL (ref 0.61–1.24)
GFR, Estimated: >60 mL/min (ref >60)
Glucose, Bld: 95 mg/dL (ref 70–99)
Potassium: 3.9 mmol/L (ref 3.5–5.1)
Sodium: 135 mmol/L (ref 135–145)

## 2024-04-07 LAB — POCT ACTIVATED CLOTTING TIME
Activated Clotting Time: 302 s
Activated Clotting Time: 326 s

## 2024-04-07 LAB — PHOSPHORUS: Phosphorus: 3 mg/dL (ref 2.5–4.6)

## 2024-04-07 LAB — LIPOPROTEIN A (LPA): Lipoprotein (a): 209.9 nmol/L — ABNORMAL HIGH (ref <75.0)

## 2024-04-07 MED ORDER — PRASUGREL HCL 10 MG PO TABS
10.0000 mg | ORAL_TABLET | Freq: Every day | ORAL | 11 refills | Status: AC
  Filled 2024-04-07: qty 30, 30d supply, fill #0
  Filled 2024-05-06: qty 30, 30d supply, fill #1
  Filled 2024-06-02: qty 30, 30d supply, fill #2
  Filled 2024-06-30: qty 30, 30d supply, fill #3
  Filled 2024-08-03: qty 30, 30d supply, fill #4
  Filled 2024-09-02: qty 30, 30d supply, fill #5
  Filled 2024-10-05: qty 30, 30d supply, fill #6
  Filled 2024-11-02: qty 30, 30d supply, fill #7
  Filled 2024-12-04: qty 30, 30d supply, fill #8
  Filled 2025-01-01: qty 30, 30d supply, fill #9

## 2024-04-07 MED ORDER — LOSARTAN POTASSIUM 25 MG PO TABS
25.0000 mg | ORAL_TABLET | Freq: Every day | ORAL | 11 refills | Status: AC
  Filled 2024-04-07: qty 30, 30d supply, fill #0
  Filled 2024-05-06: qty 30, 30d supply, fill #1
  Filled 2024-06-02: qty 30, 30d supply, fill #2
  Filled 2024-06-30: qty 30, 30d supply, fill #3
  Filled 2024-08-03: qty 30, 30d supply, fill #4
  Filled 2024-09-02: qty 30, 30d supply, fill #5
  Filled 2024-10-05: qty 30, 30d supply, fill #6
  Filled 2024-11-02: qty 30, 30d supply, fill #7
  Filled 2024-12-04: qty 30, 30d supply, fill #8
  Filled 2025-01-01: qty 30, 30d supply, fill #9

## 2024-04-07 MED ORDER — TRAZODONE HCL 50 MG PO TABS
50.0000 mg | ORAL_TABLET | Freq: Every evening | ORAL | 0 refills | Status: AC | PRN
  Filled 2024-04-07: qty 30, 30d supply, fill #0

## 2024-04-07 MED ORDER — ASPIRIN 81 MG PO TBEC
81.0000 mg | DELAYED_RELEASE_TABLET | Freq: Every day | ORAL | 12 refills | Status: AC
  Filled 2024-04-07: qty 30, 30d supply, fill #0
  Filled 2024-05-06: qty 30, 30d supply, fill #1
  Filled 2024-06-02 (×2): qty 30, 30d supply, fill #2
  Filled 2024-06-30: qty 30, 30d supply, fill #3
  Filled 2024-08-03: qty 30, 30d supply, fill #4
  Filled 2024-09-02: qty 30, 30d supply, fill #5
  Filled 2024-10-05: qty 30, 30d supply, fill #6
  Filled 2024-11-02: qty 30, 30d supply, fill #7
  Filled 2024-12-04: qty 30, 30d supply, fill #8
  Filled 2025-01-01: qty 30, 30d supply, fill #9

## 2024-04-07 MED ORDER — EZETIMIBE 10 MG PO TABS
10.0000 mg | ORAL_TABLET | Freq: Every day | ORAL | 11 refills | Status: AC
  Filled 2024-04-07: qty 30, 30d supply, fill #0
  Filled 2024-05-06: qty 30, 30d supply, fill #1
  Filled 2024-06-02: qty 30, 30d supply, fill #2
  Filled 2024-06-30: qty 30, 30d supply, fill #3
  Filled 2024-08-03: qty 30, 30d supply, fill #4
  Filled 2024-09-02: qty 30, 30d supply, fill #5
  Filled 2024-10-05: qty 30, 30d supply, fill #6
  Filled 2024-11-02: qty 30, 30d supply, fill #7
  Filled 2024-12-04: qty 30, 30d supply, fill #8
  Filled 2025-01-01: qty 30, 30d supply, fill #9

## 2024-04-07 MED ORDER — METOPROLOL SUCCINATE ER 25 MG PO TB24
12.5000 mg | ORAL_TABLET | Freq: Every day | ORAL | 11 refills | Status: AC
  Filled 2024-04-07: qty 15, 30d supply, fill #0
  Filled 2024-05-06: qty 15, 30d supply, fill #1
  Filled 2024-06-02: qty 15, 30d supply, fill #2

## 2024-04-07 MED ORDER — EZETIMIBE 10 MG PO TABS
10.0000 mg | ORAL_TABLET | Freq: Every day | ORAL | Status: DC
  Administered 2024-04-07: 10 mg via ORAL
  Filled 2024-04-07: qty 1

## 2024-04-07 MED ORDER — NITROGLYCERIN 0.4 MG SL SUBL
0.4000 mg | SUBLINGUAL_TABLET | SUBLINGUAL | 0 refills | Status: AC | PRN
Start: 2024-04-07 — End: ?
  Filled 2024-04-07: qty 25, 8d supply, fill #0

## 2024-04-07 NOTE — Plan of Care (Signed)
   Problem: Education: Goal: Understanding of cardiac disease, CV risk reduction, and recovery process will improve Outcome: Progressing Goal: Individualized Educational Video(s) Outcome: Progressing   Problem: Activity: Goal: Ability to tolerate increased activity will improve Outcome: Progressing   Problem: Cardiac: Goal: Ability to achieve and maintain adequate cardiovascular perfusion will improve Outcome: Progressing   Problem: Health Behavior/Discharge Planning: Goal: Ability to safely manage health-related needs after discharge will improve Outcome: Progressing   Problem: Education: Goal: Understanding of CV disease, CV risk reduction, and recovery process will improve Outcome: Progressing Goal: Individualized Educational Video(s) Outcome: Progressing   Problem: Activity: Goal: Ability to return to baseline activity level will improve Outcome: Progressing   Problem: Cardiovascular: Goal: Ability to achieve and maintain adequate cardiovascular perfusion will improve Outcome: Progressing Goal: Vascular access site(s) Level 0-1 will be maintained Outcome: Progressing   Problem: Health Behavior/Discharge Planning: Goal: Ability to safely manage health-related needs after discharge will improve Outcome: Progressing   Problem: Education: Goal: Knowledge of General Education information will improve Description: Including pain rating scale, medication(s)/side effects and non-pharmacologic comfort measures Outcome: Progressing   Problem: Health Behavior/Discharge Planning: Goal: Ability to manage health-related needs will improve Outcome: Progressing   Problem: Clinical Measurements: Goal: Ability to maintain clinical measurements within normal limits will improve Outcome: Progressing Goal: Will remain free from infection Outcome: Progressing Goal: Diagnostic test results will improve Outcome: Progressing Goal: Respiratory complications will improve Outcome:  Progressing Goal: Cardiovascular complication will be avoided Outcome: Progressing   Problem: Activity: Goal: Risk for activity intolerance will decrease Outcome: Progressing   Problem: Nutrition: Goal: Adequate nutrition will be maintained Outcome: Progressing   Problem: Coping: Goal: Level of anxiety will decrease Outcome: Progressing   Problem: Elimination: Goal: Will not experience complications related to bowel motility Outcome: Progressing Goal: Will not experience complications related to urinary retention Outcome: Progressing   Problem: Pain Managment: Goal: General experience of comfort will improve and/or be controlled Outcome: Progressing   Problem: Safety: Goal: Ability to remain free from injury will improve Outcome: Progressing   Problem: Skin Integrity: Goal: Risk for impaired skin integrity will decrease Outcome: Progressing

## 2024-04-07 NOTE — Discharge Summary (Signed)
 [facility] Discharge Summary   Patient: Marc [name]:[id number]  PCP: Lyle San, MD  Date of admission: 04/05/2024   Date of discharge:  04/07/2024     Discharge Diagnoses:  Principal Problem:   Unstable angina [facility]) Active Problems:   NSTEMI (non-ST elevated myocardial infarction) [facility]   CAD with hx of NSTEMI 2022 S/P PCI   Hypothyroidism   History of renal cell cancer   Admitted From: Home Disposition:  Home   Recommendations for Outpatient Follow-up:  F/u PCP in 1 wk F/u Cardio in 1 wk Follow up LABS/TEST:     Follow-up Information     Paraschos, Alexander, MD. Go in 1 week(s).   Specialty: Cardiology Why: appointment 04/10/24 @ 11.15am Contact information: [address] [facility] [place] [address] [phone]         Lyle San, MD Follow up in 1 week(s).   Specialty: Family Medicine Why: Appointment scheduled for 05/08 10:15 Please bring discharge. Contact information: [address] [place] [address] [phone]                Diet recommendation: Cardiac diet  Activity: The patient is advised to gradually reintroduce usual activities, as tolerated  Discharge Condition: stable  Code Status: Full code   History of present illness: As per the H and P dictated on admission Hospital Course:  Marc [name] is a 63 y.o. male with medical history significant for HTN, HLD NSTEMI 2022 s/p PCI, with mild LV dysfunction at the time with EF 40%, who presents to the ED with a 5-day history of chest pain worse in the hours prior to arrival.  He has been using sublingual nitroglycerin  for the past several days with adequate relief however on the night of arrival he feels to get relief with repeated nitroglycerin  tablets thus prompting the visit to the ED.  Pain is described as tightness in the mid chest radiating down the right arm and he denies nausea vomiting shortness of breath,  lightheadedness or dizziness. ED course and data review: Vitals within normal limits. Labs notable for mildly elevated troponin of 47-47.  Creatinine 1.34 with no recent baseline.  CBC, BMP unremarkable.  BNP normal. EKG, personally viewed and interpreted showing NSR at 79 with no acute ST-T wave changes Chest x-ray nonacute   Patient started on heparin  infusion Hospitalist consulted for admission.       Assessment and Plan:   # Unstable angina  CAD with history of NSTEMI 2022 s/p PCI 5 days of chest pain unrelieved with nitro.  Troponin 47->47 S/p heparin  infusion.  Continue aspirin , atorvastatin  Nitroglycerin  sublingual as needed chest pain with morphine  for breakthrough Cardiology consulted, s/p cardiac cath, RCA stents x 2 placed.  Patient was started on DAPT. Cardiac cath: 1.  Two-vessel coronary artery disease with patent stent mid left circumflex, and 95% stenosis proximal RCA, and 80% stenosis distal RCA 2.  Mild to moderate reduced left ventricular function with global hypokinesis estimated LVEF 40% 3.  Successful PCI with 4.0 x 12 mm Onyx Frontier DES proximal RCA, and 3.0 x 15 mm Onyx Frontier DES distal RCA Recommendations 1.  Dual antiplatelet therapy (aspirin  and prasugrel ) uninterrupted x 1 year 2.  Aggressive risk factor modification 3.  Probable discharge home in a.m., follow-up 1 to 2 weeks    # History of renal cell cancer S/p right nephrectomy, patient has left solitary kidney No acute issues suspected at this time   #  Hypothyroidism Continue levothyroxine    # Marijuana use disorder: Drug abuse abstinence counseling done   Body mass index is 32.04 kg/m.  Nutrition Interventions:  - Patient was instructed, not to drive, operate heavy machinery, perform activities at heights, swimming or participation in water activities or provide baby sitting services while on Pain, Sleep and Anxiety Medications; until his outpatient Physician has advised to do so again.  -  Also recommended to not to take more than prescribed Pain, Sleep and Anxiety Medications.  Patient was ambulatory without any assistance. On the day of the discharge the patient's vitals were stable, and no other acute medical condition were reported by patient. the patient was felt safe to be discharge at Home.  Consultants: Cardiology Procedures: Cardiac cath as above  Discharge Exam: General: Appear in no distress, no Rash; Oral Mucosa Clear, moist. Cardiovascular: S1 and S2 Present, no Murmur, Respiratory: normal respiratory effort, Bilateral Air entry present and no Crackles, no wheezes Abdomen: Bowel Sound present, Soft and no tenderness, no hernia Extremities: no Pedal edema, no calf tenderness Neurology: alert and oriented to time, place, and person affect appropriate.  Filed Weights   04/05/24 1929 04/07/24 0500  Weight: 118.3 kg 113.2 kg   Vitals:   04/07/24 0719 04/07/24 1101  BP: (!) 161/92 (!) 158/98  Pulse: 75 74  Resp: 17 15  Temp: 98.6 F (37 C)   SpO2: 97%     DISCHARGE MEDICATION: Allergies as of 04/07/2024       Reactions   Bee Venom Swelling   Amoxicillin Hives   Sulfamethoxazole-trimethoprim Nausea Only   Upset stomach        Medication List     TAKE these medications    acetaminophen  500 MG tablet Commonly known as: TYLENOL  Take 1,000 mg by mouth every morning.   aspirin  EC 81 MG tablet Take 1 tablet (81 mg total) by mouth daily. Swallow whole. Start taking on: April 08, 2024   atorvastatin  80 MG tablet Commonly known as: LIPITOR  Take 1 tablet (80 mg total) by mouth daily.   brimonidine 0.2 % ophthalmic solution Commonly known as: ALPHAGAN Place 1 drop into the left eye 2 (two) times daily.   dorzolamide -timolol  2-0.5 % ophthalmic solution Commonly known as: COSOPT  Place 1 drop into the left eye 2 (two) times daily.   ezetimibe  10 MG tablet Commonly known as: ZETIA  Take 1 tablet (10 mg total) by mouth daily. Start taking on:  April 08, 2024   gabapentin  300 MG capsule Commonly known as: NEURONTIN  Take 1 capsule by mouth 3 (three) times daily.   levothyroxine  200 MCG tablet Commonly known as: SYNTHROID  Take 200 mcg by mouth daily before breakfast.   losartan  25 MG tablet Commonly known as: COZAAR  Take 1 tablet (25 mg total) by mouth daily. Start taking on: April 08, 2024   metoprolol  succinate 25 MG 24 hr tablet Commonly known as: TOPROL -XL Take 0.5 tablets (12.5 mg total) by mouth daily. Start taking on: April 08, 2024   nitroGLYCERIN  0.4 MG SL tablet Commonly known as: NITROSTAT  Place 1 tablet (0.4 mg total) under the tongue every 5 (five) minutes x 3 doses as needed for chest pain.   prasugrel  10 MG Tabs tablet Commonly known as: EFFIENT  Take 1 tablet (10 mg total) by mouth daily. Start taking on: April 08, 2024   traZODone  50 MG tablet Commonly known as: DESYREL  Take 1 tablet (50 mg total) by mouth at bedtime as needed for sleep.  Allergies  Allergen Reactions   Bee Venom Swelling   Amoxicillin Hives   Sulfamethoxazole-Trimethoprim Nausea Only    Upset stomach   Discharge Instructions     Call MD for:  difficulty breathing, headache or visual disturbances   Complete by: As directed    Call MD for:  extreme fatigue   Complete by: As directed    Call MD for:  persistant dizziness or light-headedness   Complete by: As directed    Call MD for:  severe uncontrolled pain   Complete by: As directed    Call MD for:  temperature >100.4   Complete by: As directed    Diet - low sodium heart healthy   Complete by: As directed    Discharge instructions   Complete by: As directed    F/u PCP in 1 wk F/u Cardio in 1 wk   Increase activity slowly   Complete by: As directed        The results of significant diagnostics from this hospitalization (including imaging, microbiology, ancillary and laboratory) are listed below for reference.    Significant Diagnostic Studies: CARDIAC  CATHETERIZATION Result Date: 04/06/2024   Prox RCA-1 lesion is 50% stenosed.   Prox RCA-2 lesion is 95% stenosed.   Dist RCA-1 lesion is 80% stenosed.   Dist RCA-2 lesion is 50% stenosed.   Mid LAD lesion is 30% stenosed.   Prox LAD lesion is 40% stenosed.   Previously placed Prox Cx to Mid Cx stent of unknown type is  widely patent.   A drug-eluting stent was successfully placed using a STENT ONYX FRONTIER 4.0X12.   A drug-eluting stent was successfully placed using a STENT ONYX FRONTIER 3.0X15.   Post intervention, there is a 0% residual stenosis.   Post intervention, there is a 0% residual stenosis.   There is mild to moderate left ventricular systolic dysfunction.   LV end diastolic pressure is mildly elevated.   The left ventricular ejection fraction is 35-45% by visual estimate. 1.  Two-vessel coronary artery disease with patent stent mid left circumflex, and 95% stenosis proximal RCA, and 80% stenosis distal RCA 2.  Mild to moderate reduced left ventricular function with global hypokinesis estimated LVEF 40% 3.  Successful PCI with 4.0 x 12 mm Onyx Frontier DES proximal RCA, and 3.0 x 15 mm Onyx Frontier DES distal RCA Recommendations 1.  Dual antiplatelet therapy (aspirin  and prasugrel ) uninterrupted x 1 year 2.  Aggressive risk factor modification 3.  Probable discharge home in a.m., follow-up 1 to 2 weeks   DG Chest 2 View Result Date: 04/05/2024 EXAM: 2 VIEW(S) XRAY OF THE CHEST 04/05/2024 05:19:47 PM COMPARISON: 01/28/2021 CLINICAL HISTORY: CP. Chest pain x 5 days that worsened today with radiation of pain down right arm. Hx of cardiac stent placement, NSTEMI-2023. FINDINGS: LUNGS AND PLEURA: No consolidation. No pulmonary edema. No pleural effusion. No pneumothorax. HEART AND MEDIASTINUM: No acute abnormality of the cardiac and mediastinal silhouettes. BONES AND SOFT TISSUES: Degenerative changes of the visualized thoracolumbar spine. No acute osseous abnormality. IMPRESSION: 1. No acute  cardiopulmonary pathology. Electronically signed by: Zadie Herter MD 04/05/2024 07:24 PM EDT RP Workstation: [id number]   MR LUMBAR SPINE WO CONTRAST Result Date: 03/19/2024 CLINICAL DATA:  Back pain with sciatica. History of lumbar surgery. History of bladder cancer and renal cell carcinoma. EXAM: MRI LUMBAR SPINE WITHOUT CONTRAST TECHNIQUE: Multiplanar, multisequence MR imaging of the lumbar spine was performed. No intravenous contrast was administered. COMPARISON:  CT abdomen and pelvis 04/11/2021. MRI  abdomen 05/02/2021. FINDINGS: Segmentation:  Standard. Alignment:  Normal. Vertebrae: No fracture or suspicious marrow lesion. Hemangioma in the left T12 pedicle and posterior vertebral body. Mild Modic type 1 endplate changes at L1-2. Conus medullaris and cauda equina: Conus extends to the L1-2 level. Conus and cauda equina appear normal. Paraspinal and other soft tissues: Right nephrectomy. T1 and T2 hyperintense lesion in the left kidney corresponding to the hemorrhagic/proteinaceous cyst characterized on the prior MRI and for which no follow-up imaging is required. Disc levels: Diffuse congenital narrowing of the lumbar spinal canal due to short pedicles. T12-L1: A small to moderate-sized central disc extrusion with slight superior migration results in mild spinal stenosis. Patent neural foramina. L1-2: Mild facet hypertrophy without disc herniation or stenosis. L2-3: Prior posterior decompression and. Disc desiccation and mild disc space narrowing. Disc bulging, a small left paracentral disc protrusion, and mild-to-moderate facet hypertrophy result in mild right and moderate left lateral recess stenosis and mild bilateral neural foraminal stenosis without spinal stenosis. L3-4: Disc desiccation and mild disc space narrowing. Left eccentric disc bulging and mild-to-moderate facet hypertrophy result in mild bilateral lateral recess stenosis and mild left neural foraminal stenosis without spinal stenosis.  L4-5: Disc desiccation and mild disc space narrowing. Disc bulging and mild facet hypertrophy result in mild-to-moderate bilateral lateral recess stenosis and mild bilateral neural foraminal stenosis without spinal stenosis. L5-S1: Disc desiccation and moderate disc space narrowing. Disc bulging and mild-to-moderate facet hypertrophy result in mild right neural foraminal stenosis without spinal stenosis. IMPRESSION: 1. 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 2. Mild spinal stenosis at T12-L1 due to a disc extrusion. 3. Mild-to-moderate lateral recess stenosis and mild neural foraminal stenosis at L2-3, L3-4, and L4-5. Electronically Signed   By: Aundra Lee M.D.   On: 03/19/2024 15:42    Microbiology: No results found for this or any previous visit (from the past 240 hours).   Labs: CBC: Recent Labs  Lab 04/05/24 1707 04/06/24 0327 04/07/24 0451  WBC 9.4 7.0 7.7  HGB 16.0 14.8 15.9  HCT 48.0 44.8 45.7  MCV 92.8 93.1 89.8  PLT 235 184 190   Basic Metabolic Panel: Recent Labs  Lab 04/05/24 1707 04/06/24 0326 04/07/24 0451  NA 136 138 135  K 4.5 4.0 3.9  CL 102 100 107  CO2 24 27 23   GLUCOSE 93 91 95  BUN 27* 31* 20  CREATININE 1.34* 1.13 1.06  CALCIUM  9.4 9.1 8.9  MG  --   --  2.1  PHOS  --   --  3.0   Liver Function Tests: No results for input(s): "AST", "ALT", "ALKPHOS", "BILITOT", "PROT", "ALBUMIN " in the last 168 hours. No results for input(s): "LIPASE", "AMYLASE" in the last 168 hours. No results for input(s): "AMMONIA" in the last 168 hours. Cardiac Enzymes: No results for input(s): "CKTOTAL", "CKMB", "CKMBINDEX", "TROPONINI" in the last 168 hours. BNP (last 3 results) Recent Labs    04/05/24 1707  BNP 32.7   CBG: No results for input(s): "GLUCAP" in the last 168 hours.  Time spent: 35 minutes  Signed:  Althia Atlas  [facility] 04/07/2024 12:05 PM

## 2024-04-07 NOTE — Progress Notes (Signed)
 [facility] CLINIC CARDIOLOGY CONSULT NOTE       Patient ID: Marc [name] MRN: [id number] DOB/AGE: May 22, 1961 63 y.o.  Admit date: 04/05/2024 Referring Physician Dr. Brion Cancel Primary Physician Lyle San, MD  Primary Cardiologist Dr. Bary Likes (last seen 03/2022) Reason for Consultation chest pain  HPI: Marc [name] is a 63 y.o. male  with a past medical history of coronary artery disease s/p DES to Lcx 2022, hypertension, hyperlipidemia who presented to the ED on 04/05/2024 for chest pain. Cardiology was consulted for further evaluation.   Patient reports over the last 5 days he has been having episodes of chest pain. Initially these episodes would resolve with SL nitroglycerin  but last night the episode was more persistent and he decided to come to the ED for further evaluation. Workup in the ED notable for Cr 1.34, K 4.5, Hgb 16.0, WBC 9.4. troponins 47 > 47. CXR relatively unremarkable. Started on IV heparin  in the ED due to concern for unstable angina.   At the time of my evaluation this AM he is resting comfortably in bed with wife present at bedside. We discussed his symptoms in further detail. He states that his CP is similar to the episodes he had in 2022 when he had a NSTEMI and received a stent. Initially his pain would improve at home with nitroglycerin  but yesterday it did not improve despite taking a few doses so he decided to come in. Now is feeling better overall.   Review of systems complete and found to be negative unless listed above    Past Medical History:  Diagnosis Date   Arthritis    Cancer (HCC)    renal cell 2 years ago and bladder cancer 2004    Chronic kidney disease    right kidney removed 2 years ago renal cell   Coronary artery disease    Heart murmur    History of bladder cancer    Hypothyroidism    NSTEMI (non-ST elevated myocardial infarction) (HCC) 2023    Past Surgical History:  Procedure Laterality Date   APPENDECTOMY     BLADDER SURGERY      CORONARY STENT INTERVENTION N/A 01/30/2021   Procedure: CORONARY STENT INTERVENTION;  Surgeon: Percival Brace, MD;  Location: [facility] INVASIVE CV LAB;  Service: Cardiovascular;  Laterality: N/A;   CORONARY STENT INTERVENTION N/A 04/06/2024   Procedure: CORONARY STENT INTERVENTION;  Surgeon: Percival Brace, MD;  Location: [facility] INVASIVE CV LAB;  Service: Cardiovascular;  Laterality: N/A;   HERNIA REPAIR     kidney removed Right    LEFT HEART CATH AND CORONARY ANGIOGRAPHY N/A 01/30/2021   Procedure: LEFT HEART CATH AND CORONARY ANGIOGRAPHY;  Surgeon: Michelle Aid, MD;  Location: [facility] INVASIVE CV LAB;  Service: Cardiovascular;  Laterality: N/A;   LEFT HEART CATH AND CORONARY ANGIOGRAPHY N/A 04/06/2024   Procedure: LEFT HEART CATH AND CORONARY ANGIOGRAPHY;  Surgeon: Percival Brace, MD;  Location: [facility] INVASIVE CV LAB;  Service: Cardiovascular;  Laterality: N/A;   LUMBAR LAMINECTOMY/DECOMPRESSION MICRODISCECTOMY N/A 05/30/2016   Procedure: LUMBAR DECOMPRESSION DISCECTOMY L2-3;  Surgeon: Mort Ards, MD;  Location: [facility] OR;  Service: Orthopedics;  Laterality: N/A;   TONSILLECTOMY      Medications Prior to Admission  Medication Sig Dispense Refill Last Dose/Taking   acetaminophen  (TYLENOL ) 500 MG tablet Take 1,000 mg by mouth every morning.   Unknown   atorvastatin  (LIPITOR ) 80 MG tablet Take 1 tablet (80 mg total) by mouth daily. 30 tablet 0 04/05/2024 Morning   brimonidine (ALPHAGAN)  0.2 % ophthalmic solution Place 1 drop into the left eye 2 (two) times daily.   04/05/2024 Morning   dorzolamide-timolol  (COSOPT) 2-0.5 % ophthalmic solution Place 1 drop into the left eye 2 (two) times daily.   04/05/2024 Morning   gabapentin (NEURONTIN) 300 MG capsule Take 1 capsule by mouth 3 (three) times daily.   04/05/2024 Morning   levothyroxine  (SYNTHROID ) 200 MCG tablet Take 200 mcg by mouth daily before breakfast.    04/05/2024 Morning   nitroGLYCERIN  (NITROSTAT ) 0.4 MG SL tablet Place 1  tablet (0.4 mg total) under the tongue every 5 (five) minutes x 3 doses as needed for chest pain. 30 tablet 0 04/05/2024 at  4:00 PM   Social History   Socioeconomic History   Marital status: Married    Spouse name: Not on file   Number of children: Not on file   Years of education: Not on file   Highest education level: Not on file  Occupational History   Not on file  Tobacco Use   Smoking status: Former    Current packs/day: 0.00    Average packs/day: 2.0 packs/day for 30.0 years (60.0 ttl pk-yrs)    Types: Cigarettes    Start date: 12/10/1972    Quit date: 12/10/2002    Years since quitting: 21.3   Smokeless tobacco: Former    Types: Snuff    Quit date: 01/11/2016  Vaping Use   Vaping status: Never Used  Substance and Sexual Activity   Alcohol use: No   Drug use: Yes    Types: Marijuana    Comment: not even a joint a day   Sexual activity: Yes    Birth control/protection: None  Other Topics Concern   Not on file  Social History Narrative   Not on file   Social Drivers of Health   Financial Resource Strain: Low Risk  (03/17/2024)   Received from [facility]   Overall Financial Resource Strain (CARDIA)    Difficulty of Paying Living Expenses: Not hard at all  Food Insecurity: No Food Insecurity (04/06/2024)   Hunger Vital Sign    Worried About Running Out of Food in the Last Year: Never true    Ran Out of Food in the Last Year: Never true  Transportation Needs: No Transportation Needs (04/06/2024)   PRAPARE - Administrator, Civil Service (Medical): No    Lack of Transportation (Non-Medical): No  Physical Activity: Not on file  Stress: Not on file  Social Connections: Not on file  Intimate Partner Violence: Not At Risk (04/06/2024)   Humiliation, Afraid, Rape, and Kick questionnaire    Fear of Current or Ex-Partner: No    Emotionally Abused: No    Physically Abused: No    Sexually Abused: No    Family History  Problem Relation Age of  Onset   Heart failure Father      Vitals:   04/06/24 2345 04/07/24 0446 04/07/24 0500 04/07/24 0719  BP: (!) 130/91 130/83  (!) 161/92  Pulse: 89 85  75  Resp: 18 18  17   Temp: 98.7 F (37.1 C) (!) 97.5 F (36.4 C)  98.6 F (37 C)  TempSrc:      SpO2: 95% 97%  97%  Weight:   113.2 kg   Height:        PHYSICAL EXAM General: Well appearing male, well nourished, in no acute distress. HEENT: Normocephalic and atraumatic. Neck: No JVD.  Lungs: Normal respiratory effort on  room. Clear bilaterally to auscultation. No wheezes, crackles, rhonchi.  Heart: HRRR. Normal S1 and S2 without gallops or murmurs.  Abdomen: Non-distended appearing.  Msk: Normal strength and tone for age. Extremities: Warm and well perfused. No clubbing, cyanosis. No edema.  Neuro: Alert and oriented X 3. Psych: Answers questions appropriately.   Labs: Basic Metabolic Panel: Recent Labs    04/06/24 0326 04/07/24 0451  NA 138 135  K 4.0 3.9  CL 100 107  CO2 27 23  GLUCOSE 91 95  BUN 31* 20  CREATININE 1.13 1.06  CALCIUM  9.1 8.9  MG  --  2.1  PHOS  --  3.0   Liver Function Tests: No results for input(s): "AST", "ALT", "ALKPHOS", "BILITOT", "PROT", "ALBUMIN " in the last 72 hours. No results for input(s): "LIPASE", "AMYLASE" in the last 72 hours. CBC: Recent Labs    04/06/24 0327 04/07/24 0451  WBC 7.0 7.7  HGB 14.8 15.9  HCT 44.8 45.7  MCV 93.1 89.8  PLT 184 190   Cardiac Enzymes: Recent Labs    04/05/24 1707 04/05/24 1938  TROPONINIHS 47* 47*   BNP: Recent Labs    04/05/24 1707  BNP 32.7   D-Dimer: No results for input(s): "DDIMER" in the last 72 hours. Hemoglobin A1C: Recent Labs    04/06/24 0326  HGBA1C 5.4   Fasting Lipid Panel: Recent Labs    04/05/24 1707  CHOL 218*  HDL 50  LDLCALC UNABLE TO CALCULATE IF TRIGLYCERIDE OVER 400 mg/dL  TRIG 098*  CHOLHDL 4.4  LDLDIRECT 126*   Thyroid Function Tests: No results for input(s): "TSH", "T4TOTAL", "T3FREE",  "THYROIDAB" in the last 72 hours.  Invalid input(s): "FREET3" Anemia Panel: No results for input(s): "VITAMINB12", "FOLATE", "FERRITIN", "TIBC", "IRON", "RETICCTPCT" in the last 72 hours.   Radiology: CARDIAC CATHETERIZATION Result Date: 04/06/2024   Prox RCA-1 lesion is 50% stenosed.   Prox RCA-2 lesion is 95% stenosed.   Dist RCA-1 lesion is 80% stenosed.   Dist RCA-2 lesion is 50% stenosed.   Mid LAD lesion is 30% stenosed.   Prox LAD lesion is 40% stenosed.   Previously placed Prox Cx to Mid Cx stent of unknown type is  widely patent.   A drug-eluting stent was successfully placed using a STENT ONYX FRONTIER 4.0X12.   A drug-eluting stent was successfully placed using a STENT ONYX FRONTIER 3.0X15.   Post intervention, there is a 0% residual stenosis.   Post intervention, there is a 0% residual stenosis.   There is mild to moderate left ventricular systolic dysfunction.   LV end diastolic pressure is mildly elevated.   The left ventricular ejection fraction is 35-45% by visual estimate. 1.  Two-vessel coronary artery disease with patent stent mid left circumflex, and 95% stenosis proximal RCA, and 80% stenosis distal RCA 2.  Mild to moderate reduced left ventricular function with global hypokinesis estimated LVEF 40% 3.  Successful PCI with 4.0 x 12 mm Onyx Frontier DES proximal RCA, and 3.0 x 15 mm Onyx Frontier DES distal RCA Recommendations 1.  Dual antiplatelet therapy (aspirin  and prasugrel) uninterrupted x 1 year 2.  Aggressive risk factor modification 3.  Probable discharge home in a.m., follow-up 1 to 2 weeks   DG Chest 2 View Result Date: 04/05/2024 EXAM: 2 VIEW(S) XRAY OF THE CHEST 04/05/2024 05:19:47 PM COMPARISON: 01/28/2021 CLINICAL HISTORY: CP. Chest pain x 5 days that worsened today with radiation of pain down right arm. Hx of cardiac stent placement, NSTEMI-2023. FINDINGS: LUNGS AND PLEURA: No  consolidation. No pulmonary edema. No pleural effusion. No pneumothorax. HEART AND MEDIASTINUM:  No acute abnormality of the cardiac and mediastinal silhouettes. BONES AND SOFT TISSUES: Degenerative changes of the visualized thoracolumbar spine. No acute osseous abnormality. IMPRESSION: 1. No acute cardiopulmonary pathology. Electronically signed by: Zadie Herter MD 04/05/2024 07:24 PM EDT RP Workstation: [id number]   MR LUMBAR SPINE WO CONTRAST Result Date: 03/19/2024 CLINICAL DATA:  Back pain with sciatica. History of lumbar surgery. History of bladder cancer and renal cell carcinoma. EXAM: MRI LUMBAR SPINE WITHOUT CONTRAST TECHNIQUE: Multiplanar, multisequence MR imaging of the lumbar spine was performed. No intravenous contrast was administered. COMPARISON:  CT abdomen and pelvis 04/11/2021. MRI abdomen 05/02/2021. FINDINGS: Segmentation:  Standard. Alignment:  Normal. Vertebrae: No fracture or suspicious marrow lesion. Hemangioma in the left T12 pedicle and posterior vertebral body. Mild Modic type 1 endplate changes at L1-2. Conus medullaris and cauda equina: Conus extends to the L1-2 level. Conus and cauda equina appear normal. Paraspinal and other soft tissues: Right nephrectomy. T1 and T2 hyperintense lesion in the left kidney corresponding to the hemorrhagic/proteinaceous cyst characterized on the prior MRI and for which no follow-up imaging is required. Disc levels: Diffuse congenital narrowing of the lumbar spinal canal due to short pedicles. T12-L1: A small to moderate-sized central disc extrusion with slight superior migration results in mild spinal stenosis. Patent neural foramina. L1-2: Mild facet hypertrophy without disc herniation or stenosis. L2-3: Prior posterior decompression and. Disc desiccation and mild disc space narrowing. Disc bulging, a small left paracentral disc protrusion, and mild-to-moderate facet hypertrophy result in mild right and moderate left lateral recess stenosis and mild bilateral neural foraminal stenosis without spinal stenosis. L3-4: Disc desiccation and mild  disc space narrowing. Left eccentric disc bulging and mild-to-moderate facet hypertrophy result in mild bilateral lateral recess stenosis and mild left neural foraminal stenosis without spinal stenosis. L4-5: Disc desiccation and mild disc space narrowing. Disc bulging and mild facet hypertrophy result in mild-to-moderate bilateral lateral recess stenosis and mild bilateral neural foraminal stenosis without spinal stenosis. L5-S1: Disc desiccation and moderate disc space narrowing. Disc bulging and mild-to-moderate facet hypertrophy result in mild right neural foraminal stenosis without spinal stenosis. IMPRESSION: 1. 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 2. Mild spinal stenosis at T12-L1 due to a disc extrusion. 3. Mild-to-moderate lateral recess stenosis and mild neural foraminal stenosis at L2-3, L3-4, and L4-5. Electronically Signed   By: Aundra Lee M.D.   On: 03/19/2024 15:42    ECHO pending  TELEMETRY reviewed by me 04/07/2024: sinus rhythm 70s  EKG reviewed by me: NSR rate 79 bpm  Data reviewed by me 04/07/2024: last 24h vitals tele labs imaging I/O ED provider note, admission H&P  Principal Problem:   Unstable angina (HCC) Active Problems:   Hypothyroidism   History of renal cell cancer   NSTEMI (non-ST elevated myocardial infarction) [facility])   CAD with hx of NSTEMI 2022 S/P PCI    ASSESSMENT AND PLAN:  Marc [name] is a 63 y.o. male  with a past medical history of coronary artery disease s/p DES to Lcx 2022, hypertension, hyperlipidemia who presented to the ED on 04/05/2024 for chest pain. Cardiology was consulted for further evaluation.   # Unstable angina # Coronary artery disease s/p DES to LCx 2022 # Hypertension # Hyperlipidemia Patient with hx of CAD and prior stenting presented with worsening CP over the last 5 days. Similar to prior anginal episodes in 2022 preceeding NSTEMI. Troponins in the  ED 47 > 47. EKG without acute  ischemic changes. Chest pain now resolved.  LHC 04/06/2024 with proximal and distal RCA lesions s/p DES x 2, patent circumflex stent. -Echo pending.  -Start Zetia 10 mg daily. Continue aspirin  81 mg daily, Effient 10 mg daily, and atorvastatin  81 mg daily.  -Continue losartan 25 mg daily, metoprolol  12.5 mg daily. -Cardiac rehab referral has been placed on discharge.  Ok for discharge today from a cardiac perspective. Will arrange for follow up in clinic with Dr. Parks Bollman in 1-2 weeks.    This patient's plan of care was discussed and created with Dr. Parks Bollman and he is in agreement.  Signed: Hamp Levine, PA-C  04/07/2024, 8:37 AM [facility]

## 2024-04-07 NOTE — TOC CM/SW Note (Signed)
 Transition of Care [facility]) - Inpatient Brief Assessment   Patient Details  Name: Marc [name] MRN: [id number] Date of Birth: [birth year]  Transition of Care [facility]) CM/SW Contact:    Odilia Bennett, LCSW Phone Number: 04/07/2024, 10:57 AM   Clinical Narrative: Patient has orders to discharge home today. Chart reviewed. No TOC needs identified. CSW signing off.  Transition of Care Asessment: Insurance and Status: Insurance coverage has been reviewed Patient has primary care physician: Yes Home environment has been reviewed: Single family home Prior level of function:: Not documented Prior/Current Home Services: No current home services Social Drivers of Health Review: SDOH reviewed no interventions necessary Readmission risk has been reviewed: Yes Transition of care needs: no transition of care needs at this time

## 2024-04-07 NOTE — Progress Notes (Signed)
*  PRELIMINARY RESULTS* Echocardiogram 2D Echocardiogram has been performed.  Marc [name] 04/07/2024, 10:33 AM

## 2024-04-07 NOTE — Progress Notes (Signed)
*  PRELIMINARY RESULTS* Echocardiogram 2D Echocardiogram has been performed.  Marc [name] 04/07/2024, 10:34 AM

## 2024-04-10 NOTE — Progress Notes (Signed)
 Established Patient Visit   Chief Complaint: Chief Complaint  Patient presents with  . Follow-up    Follow up from cath    Date of Service: 04/10/2024 Date of Birth: 05-Aug-1961 PCP: Valora Lynwood FALCON, MD  History of Present Illness: Marc [name] is a 63 y.o.male patient who returns for   1.  NSTEMI  2.  Overlapping 2.25 x 12 mm, 2.75 x 22 mm Resolute Onyx  m LCx, 01/30/2021  3.  4.0 x 12 mm Onyx Frontier  p RCA, 3.0 x 15 mm Onyx Frontier d RCA 04/06/2024  4.  Mild ischemic cardiomyopathy  5.  Essential hypertension  6.  Hyperlipidemia  Patient previously followed by Dr. Hester.  He has history of NSTEMI, and overlapping 2.25 x 12 mm and 2.75 x 22 mm overlapping DES mid left circumflex 01/30/2021.  Patient admitted 04/05/2024 with unstable angina.  Cardiac catheterization 04/06/2024 revealed patent stents mid left circumflex, 95% stenosis proximal RCA and 80% stenosis distal RCA.  Patient underwent PCI receiving 4.0 x 12 mm Onyx Frontier DES proximal RCA, and 3.0 x 15 mm Onyx Frontier DES distal RCA.  Left ventriculography revealed LV ejection fraction of 40%.  Patient discharged home on aspirin  and prasugrel , and high intensity atorvastatin  with Zetia .  Patient returns today for follow-up, reports doing well.  He denies exertional chest pain or shortness of breath.  He denies palpitations or heart racing.  He denies peripheral edema.  The patient is active but does not exercise regularly.  The patient has essential hypertension, blood pressure well-controlled, on metoprolol  succinate and losartan , which are well-tolerated without apparent side effects.  The patient follows a low-sodium, no added salt diet.  The patient has hyperlipidemia, on high intensity atorvastatin  and Zetia , which are well-tolerated without apparent side effects, followed by his primary care provider.  Patient follows a low-cholesterol, low-fat diet.  Past Medical and Surgical History  Past Medical History Past Medical  History:  Diagnosis Date  . Bladder cancer (CMS/HHS-HCC)   . COVID-19 05/2021  . Glaucoma (increased eye pressure)   . History of myocardial infarction 02/03/2021  . Hyperlipidemia   . Hypertension   . Renal cancer (CMS/HHS-HCC)   . Thyroid disease     Past Surgical History He has a past surgical history that includes Nephrectomy (Right, 2015); Appendectomy; Tonsillectomy (1966); Umbilical hernia repair (2014); LUMBAR LAMINECTOMY/DECOMPRESSION MICRODISCECTOMY 1 LEVEL   (05/30/2016); and Colonoscopy (05/05/2012).   Medications and Allergies  Current Medications  Current Outpatient Medications  Medication Sig Dispense Refill  . acetaminophen  (TYLENOL ) 650 MG ER tablet Take 650 mg by mouth every 8 (eight) hours as needed for Pain.    . aspirin  81 MG EC tablet Take 81 mg by mouth once daily    . atorvastatin  (LIPITOR ) 80 MG tablet Take 1 tablet (80 mg total) by mouth once daily 90 tablet 4  . dorzolamide -timoloL  (COSOPT ) 22.3-6.8 mg/mL ophthalmic solution INSTILL 1 DROP IN LEFT EYE TWICE DAILY    . ezetimibe  (ZETIA ) 10 mg tablet Take 1 tablet by mouth once daily    . gabapentin  (NEURONTIN ) 300 MG capsule Take 1 capsule (300 mg total) by mouth 3 (three) times daily 90 capsule 0  . latanoprost (XALATAN) 0.005 % ophthalmic solution Place 1 drop into the left eye at bedtime    . levothyroxine  (SYNTHROID ) 200 MCG tablet TAKE 1 TABLET(200 MCG) BY MOUTH EVERY DAY 30 TO 60 MINUTES BEFORE BREAKFAST ON AN EMPTY STOMACH AND WITH A GLASS OF WATER 30 tablet 11  .  losartan  (COZAAR ) 25 MG tablet Take 1 tablet by mouth once daily    . metoprolol  SUCCinate (TOPROL -XL) 25 MG XL tablet Take 12.5 mg by mouth once daily    . nitroGLYcerin  (NITROSTAT ) 0.4 MG SL tablet Place under the tongue    . prasugreL  HCl (EFFIENT ) 10 mg tablet Take 1 tablet by mouth once daily    . traZODone  (DESYREL ) 50 MG tablet Take 50 mg by mouth at bedtime as needed    . meloxicam (MOBIC) 15 MG tablet Take 1 tablet (15 mg total) by  mouth once daily as needed (Patient not taking: Reported on 04/10/2024) 30 tablet 0   No current facility-administered medications for this visit.    Allergies: Venom-honey bee, Amoxicillin, and Bactrim [sulfamethoxazole-trimethoprim]  Social and Family History  Social History  reports that he quit smoking about 21 years ago. His smoking use included cigarettes. He has quit using smokeless tobacco.  His smokeless tobacco use included snuff. He reports that he does not currently use alcohol. He reports current drug use. Drug: Other-see comments.  Family History Family History  Problem Relation Name Age of Onset  . Thyroid disease Mother    . Stroke Father    . Diabetes type I Father    . Coronary Artery Disease (Blocked arteries around heart) Father  50  . Thyroid disease Sister    . Prostate cancer Brother  2  . No Known Problems Daughter    . Stroke Son    . No Known Problems Son    . No Known Problems Son    . Colon cancer Neg Hx    . Colon polyps Neg Hx      Review of Systems   Review of Systems: The patient denies chest pain, shortness of breath, orthopnea, paroxysmal nocturnal dyspnea, pedal edema, palpitations, heart racing, presyncope, syncope. Review of 12 Systems is negative except as described above.  Physical Examination   Vitals:BP 120/80   Pulse 99   Ht 188 cm (6' 2)   Wt (!) 116.6 kg (257 lb)   SpO2 96%   BMI 33.00 kg/m  Ht:188 cm (6' 2) Wt:(!) 116.6 kg (257 lb) ADJ:Anib surface area is 2.47 meters squared. Body mass index is 33 kg/m.  HEENT: Pupils equally reactive to light and accomodation  Neck: Supple without thyromegaly, carotid pulses 2+ Lungs: clear to auscultation bilaterally; no wheezes, rales, rhonchi Heart: Regular rate and rhythm.  No gallops, murmurs or rub Abdomen: soft nontender, nondistended, with normal bowel sounds Extremities: no cyanosis, clubbing, or edema Peripheral Pulses: 2+ in all extremities, 2+ femoral pulses  bilaterally  Assessment   63 y.o. male with  1. Coronary artery disease involving native coronary artery of native heart with other form of angina pectoris ()   2. Benign essential HTN   3. Non-ST elevation (NSTEMI) myocardial infarction (CMS/HHS-HCC)   4. Hyperlipidemia, mixed    63 year old gentleman with history of NSTEMI, overlapping DES mid left circumflex 01/30/2021, with recent hospitalization 04/05/2024 with unstable angina at which time cardiac catheterization revealed patent stents left circumflex, and new high-grade stenosis proximal and distal RCA, underwent PCI with 4.0 x 12 mm Onyx Frontier DES proximal RCA, and 3.0 x 15 mm Onyx Frontier DES distal RCA.  The patient has essential hypertension, blood pressure well-controlled on current BP medications.  The patient has hyperlipidemia, on high intensity atorvastatin  and Zetia .    Plan   1.  Continue current medications 2.  Counseled patient about low-sodium diet 3.  DASH diet printed instructions given to the patient 4.  Counseled patient about low-cholesterol diet 5.  Continue high intensity atorvastatin  and Zetia  for hyperlipidemia management 6.  Low-fat and cholesterol diet print instructions given to the patient 7.  Enroll in cardiac rehabilitation 8.  Continue dual antiplatelet therapy (aspirin  and prasugrel ) uninterrupted x 1 year 9.  Return to clinic for follow-up in 3 months  No orders of the defined types were placed in this encounter.   Return in about 3 months (around 07/11/2024).  MARSA DOOMS, MD PhD [facility]

## 2024-04-14 ENCOUNTER — Other Ambulatory Visit: Payer: Self-pay

## 2024-05-06 ENCOUNTER — Other Ambulatory Visit: Payer: Self-pay

## 2024-06-02 ENCOUNTER — Other Ambulatory Visit: Payer: Self-pay

## 2024-06-30 ENCOUNTER — Other Ambulatory Visit: Payer: Self-pay

## 2024-07-17 ENCOUNTER — Other Ambulatory Visit: Payer: Self-pay | Admitting: Urology

## 2024-08-03 ENCOUNTER — Other Ambulatory Visit: Payer: Self-pay

## 2024-09-02 ENCOUNTER — Other Ambulatory Visit: Payer: Self-pay

## 2024-10-05 ENCOUNTER — Other Ambulatory Visit: Payer: Self-pay

## 2024-10-14 ENCOUNTER — Other Ambulatory Visit: Admitting: Urology

## 2024-11-02 ENCOUNTER — Other Ambulatory Visit: Payer: Self-pay

## 2024-12-04 ENCOUNTER — Other Ambulatory Visit: Payer: Self-pay

## 2025-01-02 ENCOUNTER — Other Ambulatory Visit: Payer: Self-pay
